# Patient Record
Sex: Male | Born: 1985 | Race: Black or African American | Hispanic: No | Marital: Single | Smoking: Former smoker
Health system: Southern US, Community
[De-identification: ages and names within clinical notes are randomized; demographics above are authoritative.]

## PROBLEM LIST (undated history)

## (undated) DIAGNOSIS — W3400XA Accidental discharge from unspecified firearms or gun, initial encounter: Secondary | ICD-10-CM

## (undated) DIAGNOSIS — R519 Headache, unspecified: Secondary | ICD-10-CM

## (undated) HISTORY — PX: HERNIA REPAIR: SHX51

---
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
## 2008-01-01 ENCOUNTER — Emergency Department (HOSPITAL_COMMUNITY): Admission: EM | Admit: 2008-01-01 | Discharge: 2008-01-01 | Payer: Self-pay | Admitting: Emergency Medicine

## 2009-06-26 ENCOUNTER — Emergency Department (HOSPITAL_COMMUNITY): Admission: EM | Admit: 2009-06-26 | Discharge: 2009-06-26 | Payer: Self-pay | Admitting: Emergency Medicine

## 2010-02-26 ENCOUNTER — Emergency Department (HOSPITAL_COMMUNITY): Admission: EM | Admit: 2010-02-26 | Discharge: 2010-02-26 | Payer: Self-pay | Admitting: Emergency Medicine

## 2010-08-31 ENCOUNTER — Emergency Department (HOSPITAL_COMMUNITY)
Admission: EM | Admit: 2010-08-31 | Discharge: 2010-08-31 | Disposition: A | Discharge status: Home / Self Care | Payer: Self-pay | Attending: Emergency Medicine | Admitting: Emergency Medicine

## 2010-08-31 DIAGNOSIS — J45909 Unspecified asthma, uncomplicated: Secondary | ICD-10-CM | POA: Insufficient documentation

## 2010-08-31 DIAGNOSIS — L259 Unspecified contact dermatitis, unspecified cause: Secondary | ICD-10-CM | POA: Insufficient documentation

## 2010-08-31 DIAGNOSIS — J309 Allergic rhinitis, unspecified: Secondary | ICD-10-CM | POA: Insufficient documentation

## 2010-08-31 DIAGNOSIS — R0602 Shortness of breath: Secondary | ICD-10-CM | POA: Insufficient documentation

## 2010-12-24 ENCOUNTER — Emergency Department (HOSPITAL_COMMUNITY)
Admission: EM | Admit: 2010-12-24 | Discharge: 2010-12-24 | Disposition: A | Discharge status: Home / Self Care | Payer: Self-pay | Attending: Emergency Medicine | Admitting: Emergency Medicine

## 2010-12-24 DIAGNOSIS — R221 Localized swelling, mass and lump, neck: Secondary | ICD-10-CM | POA: Insufficient documentation

## 2010-12-24 DIAGNOSIS — J45909 Unspecified asthma, uncomplicated: Secondary | ICD-10-CM | POA: Insufficient documentation

## 2010-12-24 DIAGNOSIS — R220 Localized swelling, mass and lump, head: Secondary | ICD-10-CM | POA: Insufficient documentation

## 2010-12-27 ENCOUNTER — Inpatient Hospital Stay (INDEPENDENT_AMBULATORY_CARE_PROVIDER_SITE_OTHER)
Admission: RE | Admit: 2010-12-27 | Discharge: 2010-12-27 | Disposition: A | Discharge status: Home / Self Care | Payer: Self-pay | Source: Ambulatory Visit | Attending: Family Medicine | Admitting: Family Medicine

## 2010-12-27 DIAGNOSIS — L0390 Cellulitis, unspecified: Secondary | ICD-10-CM

## 2010-12-30 LAB — CULTURE, ROUTINE-ABSCESS

## 2011-02-04 ENCOUNTER — Emergency Department (HOSPITAL_COMMUNITY)
Admission: EM | Admit: 2011-02-04 | Discharge: 2011-02-05 | Disposition: A | Discharge status: Home / Self Care | Payer: Self-pay | Attending: Emergency Medicine | Admitting: Emergency Medicine

## 2011-02-04 DIAGNOSIS — R066 Hiccough: Secondary | ICD-10-CM | POA: Insufficient documentation

## 2011-02-04 DIAGNOSIS — K219 Gastro-esophageal reflux disease without esophagitis: Secondary | ICD-10-CM | POA: Insufficient documentation

## 2011-02-05 ENCOUNTER — Emergency Department (HOSPITAL_COMMUNITY): Payer: Self-pay

## 2011-02-05 LAB — CBC
HCT: 44.4 % (ref 39.0–52.0)
MCV: 91.9 fL (ref 78.0–100.0)
Platelets: 240 10*3/uL (ref 150–400)
RBC: 4.83 MIL/uL (ref 4.22–5.81)
RDW: 11.4 % — ABNORMAL LOW (ref 11.5–15.5)
WBC: 6.3 10*3/uL (ref 4.0–10.5)

## 2011-02-05 LAB — DIFFERENTIAL
Basophils Absolute: 0 10*3/uL (ref 0.0–0.1)
Eosinophils Relative: 1 % (ref 0–5)
Lymphocytes Relative: 57 % — ABNORMAL HIGH (ref 12–46)
Lymphs Abs: 3.6 10*3/uL (ref 0.7–4.0)
Neutrophils Relative %: 34 % — ABNORMAL LOW (ref 43–77)

## 2011-02-05 LAB — COMPREHENSIVE METABOLIC PANEL
AST: 40 U/L — ABNORMAL HIGH (ref 0–37)
Albumin: 4.5 g/dL (ref 3.5–5.2)
Alkaline Phosphatase: 83 U/L (ref 39–117)
BUN: 11 mg/dL (ref 6–23)
CO2: 29 mEq/L (ref 19–32)
Chloride: 100 mEq/L (ref 96–112)
GFR calc non Af Amer: >60 mL/min (ref >60)
Potassium: 3.2 mEq/L — ABNORMAL LOW (ref 3.5–5.1)
Total Bilirubin: 0.7 mg/dL (ref 0.3–1.2)

## 2011-04-16 ENCOUNTER — Emergency Department (HOSPITAL_COMMUNITY): Payer: Self-pay

## 2011-04-16 ENCOUNTER — Emergency Department (HOSPITAL_COMMUNITY)
Admission: EM | Admit: 2011-04-16 | Discharge: 2011-04-17 | Disposition: A | Discharge status: Home / Self Care | Payer: Self-pay | Attending: Emergency Medicine | Admitting: Emergency Medicine

## 2011-04-16 ENCOUNTER — Encounter: Payer: Self-pay | Admitting: Emergency Medicine

## 2011-04-16 DIAGNOSIS — J45909 Unspecified asthma, uncomplicated: Secondary | ICD-10-CM | POA: Insufficient documentation

## 2011-04-16 DIAGNOSIS — W3400XA Accidental discharge from unspecified firearms or gun, initial encounter: Secondary | ICD-10-CM

## 2011-04-16 DIAGNOSIS — Z1810 Retained metal fragments, unspecified: Secondary | ICD-10-CM | POA: Insufficient documentation

## 2011-04-16 DIAGNOSIS — S81009A Unspecified open wound, unspecified knee, initial encounter: Secondary | ICD-10-CM | POA: Insufficient documentation

## 2011-04-16 DIAGNOSIS — M79609 Pain in unspecified limb: Secondary | ICD-10-CM | POA: Insufficient documentation

## 2011-04-16 LAB — BASIC METABOLIC PANEL
BUN: 10 mg/dL (ref 6–23)
Calcium: 8.9 mg/dL (ref 8.4–10.5)
Creatinine, Ser: 1.47 mg/dL — ABNORMAL HIGH (ref 0.50–1.35)
GFR calc Af Amer: 76 mL/min — ABNORMAL LOW (ref >90)
GFR calc non Af Amer: 66 mL/min — ABNORMAL LOW (ref >90)

## 2011-04-16 LAB — CBC
HCT: 39 % (ref 39.0–52.0)
MCHC: 34.9 g/dL (ref 30.0–36.0)
MCV: 93.8 fL (ref 78.0–100.0)
Platelets: 183 10*3/uL (ref 150–400)
RDW: 11.8 % (ref 11.5–15.5)

## 2011-04-16 MED ORDER — CEFAZOLIN SODIUM 1-5 GM-% IV SOLN
1.0000 g | Freq: Once | INTRAVENOUS | Status: AC
  Administered 2011-04-16: 1 g via INTRAVENOUS
  Filled 2011-04-16: qty 50

## 2011-04-16 MED ORDER — SODIUM CHLORIDE 0.9 % IV BOLUS (SEPSIS)
250.0000 mL | Freq: Once | INTRAVENOUS | Status: AC
  Administered 2011-04-16: 250 mL via INTRAVENOUS

## 2011-04-16 MED ORDER — NAPROXEN 500 MG PO TABS: 500.0000 mg | ORAL_TABLET | Freq: Two times a day (BID) | ORAL | Status: AC

## 2011-04-16 MED ORDER — HYDROMORPHONE HCL 2 MG/ML IJ SOLN: 1.0000 mg | Freq: Once | INTRAMUSCULAR | Status: DC

## 2011-04-16 MED ORDER — SODIUM CHLORIDE 0.9 % IV SOLN
INTRAVENOUS | Status: DC
  Administered 2011-04-16: 22:00:00 via INTRAVENOUS

## 2011-04-16 MED ORDER — HYDROCODONE-ACETAMINOPHEN 5-325 MG PO TABS: 2.0000 | ORAL_TABLET | ORAL | Status: AC | PRN

## 2011-04-16 MED ORDER — HYDROMORPHONE HCL PF 1 MG/ML IJ SOLN
INTRAMUSCULAR | Status: AC
  Filled 2011-04-16: qty 1

## 2011-04-16 MED ORDER — TETANUS-DIPHTH-ACELL PERTUSSIS 5-2.5-18.5 LF-MCG/0.5 IM SUSP
0.5000 mL | Freq: Once | INTRAMUSCULAR | Status: AC
  Administered 2011-04-16: 0.5 mL via INTRAMUSCULAR
  Filled 2011-04-16: qty 0.5

## 2011-04-16 MED ORDER — HYDROMORPHONE HCL PF 1 MG/ML IJ SOLN
1.0000 mg | Freq: Once | INTRAMUSCULAR | Status: AC
  Administered 2011-04-16: 1 mg via INTRAVENOUS
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
## 2015-11-28 ENCOUNTER — Encounter (HOSPITAL_COMMUNITY): Payer: Self-pay | Admitting: Emergency Medicine

## 2015-11-28 DIAGNOSIS — S70312A Abrasion, left thigh, initial encounter: Secondary | ICD-10-CM | POA: Insufficient documentation

## 2015-11-28 DIAGNOSIS — S92421A Displaced fracture of distal phalanx of right great toe, initial encounter for closed fracture: Secondary | ICD-10-CM | POA: Insufficient documentation

## 2015-11-28 DIAGNOSIS — Y999 Unspecified external cause status: Secondary | ICD-10-CM | POA: Insufficient documentation

## 2015-11-28 DIAGNOSIS — Y929 Unspecified place or not applicable: Secondary | ICD-10-CM | POA: Insufficient documentation

## 2015-11-28 DIAGNOSIS — S00511A Abrasion of lip, initial encounter: Secondary | ICD-10-CM | POA: Insufficient documentation

## 2015-11-28 DIAGNOSIS — Y9339 Activity, other involving climbing, rappelling and jumping off: Secondary | ICD-10-CM | POA: Insufficient documentation

## 2015-11-28 DIAGNOSIS — Z87891 Personal history of nicotine dependence: Secondary | ICD-10-CM | POA: Insufficient documentation

## 2015-11-28 DIAGNOSIS — S93402A Sprain of unspecified ligament of left ankle, initial encounter: Secondary | ICD-10-CM | POA: Insufficient documentation

## 2015-11-28 DIAGNOSIS — J45909 Unspecified asthma, uncomplicated: Secondary | ICD-10-CM | POA: Insufficient documentation

## 2015-11-28 DIAGNOSIS — W1789XA Other fall from one level to another, initial encounter: Secondary | ICD-10-CM | POA: Insufficient documentation

## 2015-11-28 NOTE — ED Notes (Signed)
Pt st's he was running and jump off of a step.  Pt c/o pain to left ankle and right great toe

## 2015-11-29 ENCOUNTER — Emergency Department (HOSPITAL_COMMUNITY): Payer: Self-pay

## 2015-11-29 ENCOUNTER — Emergency Department (HOSPITAL_COMMUNITY)
Admission: EM | Admit: 2015-11-29 | Discharge: 2015-11-29 | Disposition: A | Discharge status: Home / Self Care | Payer: Self-pay | Attending: Emergency Medicine | Admitting: Emergency Medicine

## 2015-11-29 DIAGNOSIS — S93402A Sprain of unspecified ligament of left ankle, initial encounter: Secondary | ICD-10-CM

## 2015-11-29 DIAGNOSIS — S92401A Displaced unspecified fracture of right great toe, initial encounter for closed fracture: Secondary | ICD-10-CM

## 2015-11-29 DIAGNOSIS — T07XXXA Unspecified multiple injuries, initial encounter: Secondary | ICD-10-CM

## 2015-11-29 HISTORY — DX: Accidental discharge from unspecified firearms or gun, initial encounter: W34.00XA

## 2015-11-29 MED ORDER — HYDROCODONE-ACETAMINOPHEN 5-325 MG PO TABS: 1.0000 | ORAL_TABLET | Freq: Four times a day (QID) | ORAL | Status: DC | PRN

## 2015-11-29 MED ORDER — TETANUS-DIPHTH-ACELL PERTUSSIS 5-2.5-18.5 LF-MCG/0.5 IM SUSP
0.5000 mL | Freq: Once | INTRAMUSCULAR | Status: AC
  Administered 2015-11-29: 0.5 mL via INTRAMUSCULAR
  Filled 2015-11-29: qty 0.5

## 2015-11-29 MED ORDER — NAPROXEN 500 MG PO TABS: 500.0000 mg | ORAL_TABLET | Freq: Two times a day (BID) | ORAL | Status: DC
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
## 2015-11-29 NOTE — Discharge Instructions (Signed)
Follow up with the orthopedic doctor for your broken toe and ankle sprain. Do not take the narcotic pain medication if driving as it will make you sleepy.

## 2017-04-18 IMAGING — CR DG TOE GREAT 2+V*R*
3 series · 3 of 3 positions shown · non-contrast
Comparison: None.

CLINICAL DATA: Injury to the right great toe.  Initial encounter.

EXAM:
RIGHT GREAT TOE

[toe ap]
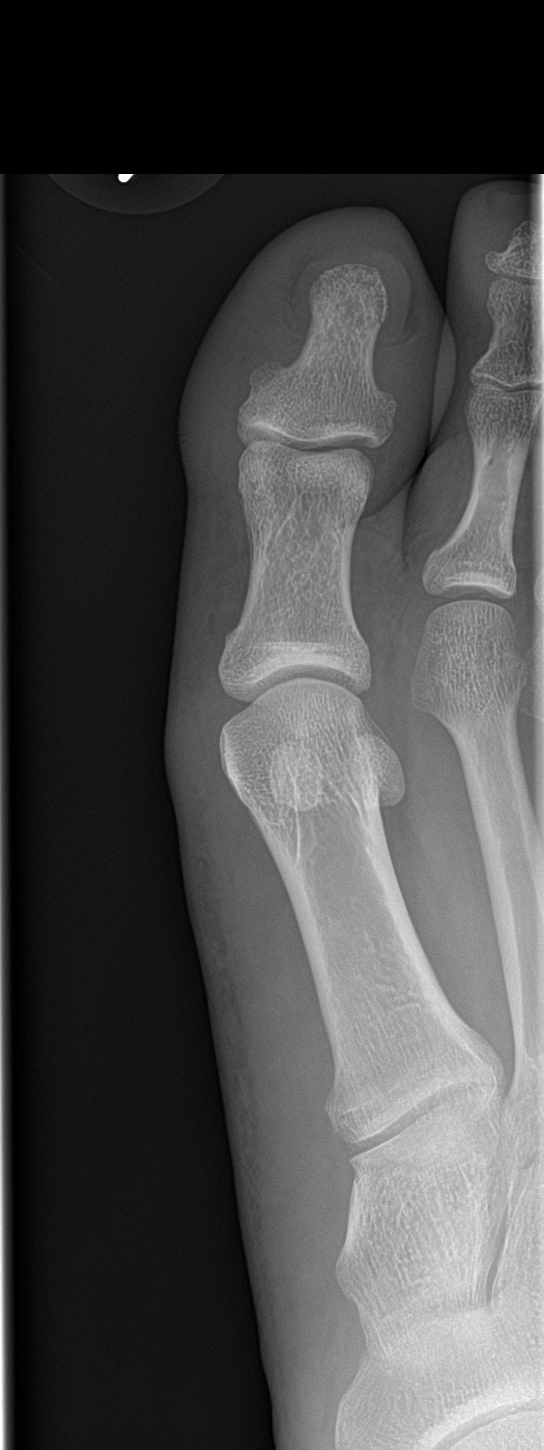

[toe obl]
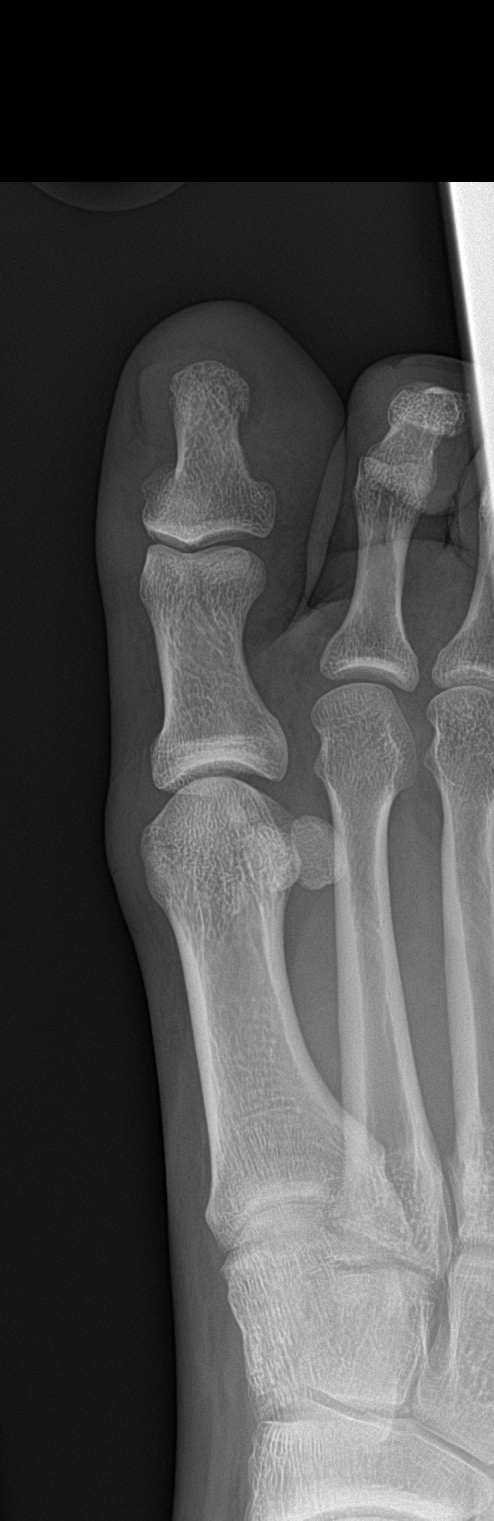

[toe lat]
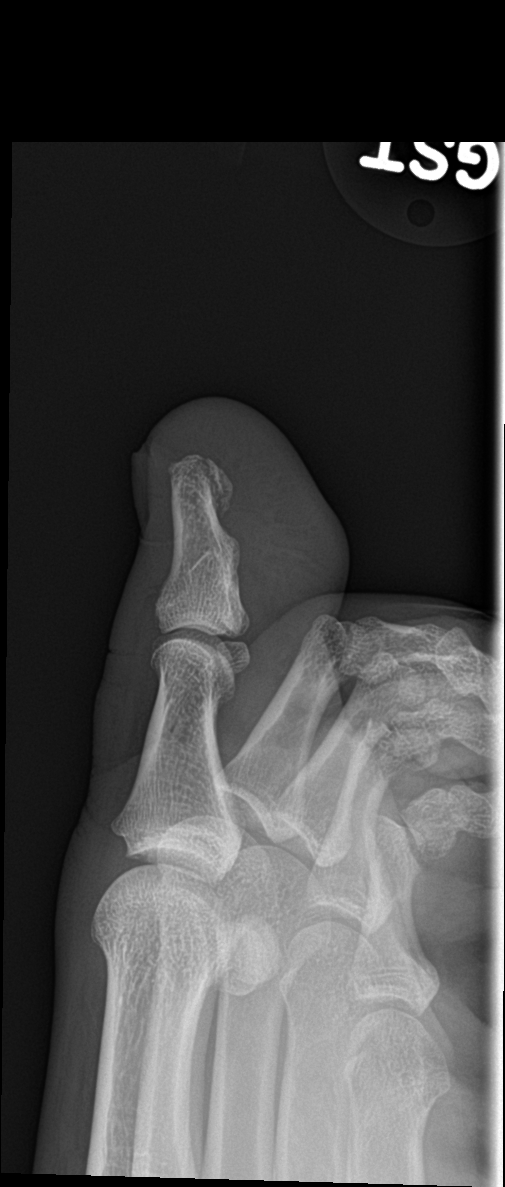

[3 of 3 positions shown; findings below may reference images not displayed]

FINDINGS: There is suggestion of a small fracture involving the plantar aspect
of the distal tuft of the first distal phalanx.

Visualized joint spaces are preserved. No definite soft tissue
abnormalities are characterized on radiograph.
IMPRESSION: Suggestion of small fracture involving the plantar aspect of the
distal tuft of the first distal phalanx.

## 2018-04-08 ENCOUNTER — Ambulatory Visit (HOSPITAL_COMMUNITY)
Admission: EM | Admit: 2018-04-08 | Discharge: 2018-04-08 | Disposition: A | Discharge status: Home / Self Care | Payer: Self-pay | Attending: Family Medicine | Admitting: Family Medicine

## 2018-04-08 ENCOUNTER — Encounter (HOSPITAL_COMMUNITY): Payer: Self-pay | Admitting: Emergency Medicine

## 2018-04-08 DIAGNOSIS — Z202 Contact with and (suspected) exposure to infections with a predominantly sexual mode of transmission: Secondary | ICD-10-CM | POA: Insufficient documentation

## 2018-04-08 DIAGNOSIS — Z113 Encounter for screening for infections with a predominantly sexual mode of transmission: Secondary | ICD-10-CM

## 2018-04-08 DIAGNOSIS — Z87891 Personal history of nicotine dependence: Secondary | ICD-10-CM | POA: Insufficient documentation

## 2018-04-08 MED ORDER — AZITHROMYCIN 250 MG PO TABS
ORAL_TABLET | ORAL | Status: AC
  Filled 2018-04-08: qty 4

## 2018-04-08 MED ORDER — METRONIDAZOLE 500 MG PO TABS
ORAL_TABLET | ORAL | Status: AC
  Filled 2018-04-08: qty 4

## 2018-04-08 MED ORDER — LIDOCAINE HCL (PF) 1 % IJ SOLN
INTRAMUSCULAR | Status: AC
  Filled 2018-04-08: qty 2

## 2018-04-08 MED ORDER — CEFTRIAXONE SODIUM 250 MG IJ SOLR
INTRAMUSCULAR | Status: AC
  Filled 2018-04-08: qty 250

## 2018-04-08 MED ORDER — CEFTRIAXONE SODIUM 250 MG IJ SOLR
250.0000 mg | Freq: Once | INTRAMUSCULAR | Status: AC
  Administered 2018-04-08: 250 mg via INTRAMUSCULAR

## 2018-04-08 MED ORDER — METRONIDAZOLE 500 MG PO TABS
2000.0000 mg | ORAL_TABLET | Freq: Once | ORAL | Status: AC
  Administered 2018-04-08: 2000 mg via ORAL

## 2018-04-08 MED ORDER — AZITHROMYCIN 250 MG PO TABS
1000.0000 mg | ORAL_TABLET | Freq: Once | ORAL | Status: AC
  Administered 2018-04-08: 1000 mg via ORAL
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
## 2018-04-08 NOTE — ED Triage Notes (Signed)
Pt requesting STD testing and treatment

## 2018-04-08 NOTE — Discharge Instructions (Addendum)
You were treated empirically for gonorrhea, chlamydia, trichomonas.  Flagyl 2 g by mouth, azithromycin 1g by mouth and Rocephin 250mg  injection given in office today. Cytology sent, you will be contacted with any positive results that requires further treatment. Refrain from sexual activity and alcohol use for the next 7 days.

## 2018-04-09 LAB — URINE CYTOLOGY ANCILLARY ONLY
CHLAMYDIA, DNA PROBE: NEGATIVE
Neisseria Gonorrhea: NEGATIVE
Trichomonas: NEGATIVE

## 2021-04-12 ENCOUNTER — Encounter: Payer: Self-pay | Admitting: Emergency Medicine

## 2021-04-12 ENCOUNTER — Ambulatory Visit
Admission: EM | Admit: 2021-04-12 | Discharge: 2021-04-12 | Disposition: A | Discharge status: Home / Self Care | Payer: PRIVATE HEALTH INSURANCE | Attending: Physician Assistant | Admitting: Physician Assistant

## 2021-04-12 DIAGNOSIS — R220 Localized swelling, mass and lump, head: Secondary | ICD-10-CM

## 2021-04-12 MED ORDER — PREDNISONE 20 MG PO TABS: 40.0000 mg | ORAL_TABLET | Freq: Every day | ORAL | 0 refills | Status: AC

## 2021-04-12 NOTE — ED Triage Notes (Signed)
Patient c/o lip swelling x 4 days, not sure if it's allergy or tooth related.  No change in diet.  Patient has not taken Benadryl for the swelling.
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
## 2021-04-18 ENCOUNTER — Other Ambulatory Visit: Payer: Self-pay

## 2021-04-18 ENCOUNTER — Ambulatory Visit
Admission: EM | Admit: 2021-04-18 | Discharge: 2021-04-18 | Disposition: A | Discharge status: Home / Self Care | Payer: PRIVATE HEALTH INSURANCE | Attending: Physician Assistant | Admitting: Physician Assistant

## 2021-04-18 ENCOUNTER — Ambulatory Visit: Payer: Self-pay

## 2021-04-18 ENCOUNTER — Encounter: Payer: Self-pay | Admitting: Physician Assistant

## 2021-04-18 DIAGNOSIS — R220 Localized swelling, mass and lump, head: Secondary | ICD-10-CM | POA: Diagnosis not present

## 2021-04-18 MED ORDER — DOXYCYCLINE HYCLATE 100 MG PO CAPS: 100.0000 mg | ORAL_CAPSULE | Freq: Two times a day (BID) | ORAL | 0 refills | Status: DC
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
## 2021-04-18 NOTE — Telephone Encounter (Signed)
Pt c/o persistent upper lip swelling and swelling along the bridge of his nose. Pt went to [facility] 04/12/21 and was given 5 day of Prednisone. Pt stated that med did not help. Pt stated the area feels like their is fluid or something in the swelling. Pt stated the inside of his upper lip has a place but did not elaborate.  No pain or itching. Advised pt to go back to UC within the hour to get evaluated. Pt stated he will go -Called UC at [facility] and transferred pt to them.        Reason for Disposition  Lip swelling lasts > 3 days  Answer Assessment - Initial Assessment Questions 1. ONSET: "When did the swelling start?" (e.g., minutes, hours, days)    04/08/21 2. LOCATION: "What part of the face is swollen?"     Area above upper lip and along bridge of his nose 3. SEVERITY: "How swollen is it?"     Pt stated it is very noticeable especially when he smiles 4. ITCHING: "Is there any itching?" If Yes, ask: "How much?"   (Scale 1-10; mild, moderate or severe)      5. PAIN: "Is the swelling painful to touch?" If Yes, ask: "How painful is it?"   (Scale 1-10; mild, moderate or severe)   - NONE (0): no pain   - MILD (1-3): doesn't interfere with normal activities    - MODERATE (4-7): interferes with normal activities or awakens from sleep    - SEVERE (8-10): excruciating pain, unable to do any normal activities       6. FEVER: "Do you have a fever?" If Yes, ask: "What is it, how was it measured, and when did it start?"      no 7. CAUSE: "What do you think is causing the face swelling?"     Unknown at this time 8. RECURRENT SYMPTOM: "Have you had face swelling before?" If Yes, ask: "When was the last time?" "What happened that time?"     Yes-  9. OTHER SYMPTOMS: "Do you have any other symptoms?" (e.g., toothache, leg swelling)     no 10. PREGNANCY: "Is there any chance you are pregnant?" "When was your last menstrual period?"       N/a  Answer Assessment - Initial Assessment Questions 1.  ONSET: "When did the swelling start?" (e.g., minutes, hours, days)     04/08/21 2. SEVERITY: "How swollen is it?"     Pt stated it is sticking out and very noticeable when smiling 3. ITCHING: "Is there any itching?" If Yes, ask: "How much?"   (Scale 1-10; mild, moderate or severe)     no 4. PAIN: "Is the swelling painful to touch?" If Yes, ask: "How painful is it?"   (Scale 1-10; mild, moderate or severe)     no 5. CAUSE: "What do you think is causing the lip swelling?"     unknown 6. RECURRENT SYMPTOM: "Have you had lip swelling before?" If Yes, ask: "When was the last time?" "What happened that time?"     no 7. OTHER SYMPTOMS: "Do you have any other symptoms?" (e.g., toothache)     no 8. PREGNANCY: "Is there any chance you are pregnant?" "When was your last menstrual period?"     N/a  Protocols used: Face Swelling-A-AH, Lip Swelling-A-AH

## 2021-04-18 NOTE — ED Triage Notes (Signed)
Pt c/o swelling with a hard knot to top of lip for over a week. States treated with prednisone last week with no relief. Denies drainage at this time. States it has been cut open before.

## 2021-07-03 ENCOUNTER — Other Ambulatory Visit: Payer: Self-pay

## 2021-07-04 ENCOUNTER — Encounter: Payer: Self-pay | Admitting: Family Medicine

## 2021-07-04 ENCOUNTER — Other Ambulatory Visit (HOSPITAL_COMMUNITY)
Admission: RE | Admit: 2021-07-04 | Discharge: 2021-07-04 | Disposition: A | Discharge status: Home / Self Care | Payer: PRIVATE HEALTH INSURANCE | Source: Ambulatory Visit | Attending: Family Medicine | Admitting: Family Medicine

## 2021-07-04 ENCOUNTER — Ambulatory Visit (INDEPENDENT_AMBULATORY_CARE_PROVIDER_SITE_OTHER): Payer: PRIVATE HEALTH INSURANCE | Admitting: Family Medicine

## 2021-07-04 VITALS — BP 128/76 | HR 69 | Temp 97.8°F | Ht 68.0 in | Wt 211.2 lb

## 2021-07-04 DIAGNOSIS — Z113 Encounter for screening for infections with a predominantly sexual mode of transmission: Secondary | ICD-10-CM | POA: Insufficient documentation

## 2021-07-04 DIAGNOSIS — F5101 Primary insomnia: Secondary | ICD-10-CM | POA: Insufficient documentation

## 2021-07-04 DIAGNOSIS — Z114 Encounter for screening for human immunodeficiency virus [HIV]: Secondary | ICD-10-CM

## 2021-07-04 DIAGNOSIS — L720 Epidermal cyst: Secondary | ICD-10-CM | POA: Diagnosis not present

## 2021-07-04 DIAGNOSIS — Z0000 Encounter for general adult medical examination without abnormal findings: Secondary | ICD-10-CM

## 2021-07-04 DIAGNOSIS — K4091 Unilateral inguinal hernia, without obstruction or gangrene, recurrent: Secondary | ICD-10-CM | POA: Diagnosis not present

## 2021-07-04 DIAGNOSIS — L309 Dermatitis, unspecified: Secondary | ICD-10-CM | POA: Insufficient documentation

## 2021-07-04 DIAGNOSIS — Z1159 Encounter for screening for other viral diseases: Secondary | ICD-10-CM

## 2021-07-04 LAB — LIPID PANEL
Cholesterol: 188 mg/dL (ref 0–200)
HDL: 36.4 mg/dL — ABNORMAL LOW (ref >39.00)
LDL Cholesterol: 116 mg/dL — ABNORMAL HIGH (ref 0–99)
NonHDL: 151.38
Total CHOL/HDL Ratio: 5
Triglycerides: 177 mg/dL — ABNORMAL HIGH (ref 0.0–149.0)
VLDL: 35.4 mg/dL (ref 0.0–40.0)

## 2021-07-04 LAB — CBC
HCT: 44.6 % (ref 39.0–52.0)
Hemoglobin: 15 g/dL (ref 13.0–17.0)
MCHC: 33.6 g/dL (ref 30.0–36.0)
MCV: 96.8 fl (ref 78.0–100.0)
Platelets: 192 10*3/uL (ref 150.0–400.0)
RBC: 4.6 Mil/uL (ref 4.22–5.81)
RDW: 12.9 % (ref 11.5–15.5)
WBC: 4.5 10*3/uL (ref 4.0–10.5)

## 2021-07-04 LAB — COMPREHENSIVE METABOLIC PANEL
ALT: 20 U/L (ref 0–53)
AST: 22 U/L (ref 0–37)
Albumin: 4.3 g/dL (ref 3.5–5.2)
Alkaline Phosphatase: 68 U/L (ref 39–117)
BUN: 15 mg/dL (ref 6–23)
CO2: 28 mEq/L (ref 19–32)
Calcium: 9.3 mg/dL (ref 8.4–10.5)
Chloride: 105 mEq/L (ref 96–112)
Creatinine, Ser: 1.36 mg/dL (ref 0.40–1.50)
GFR: 68.13 mL/min (ref >60.00)
Glucose, Bld: 97 mg/dL (ref 70–99)
Potassium: 3.6 mEq/L (ref 3.5–5.1)
Sodium: 139 mEq/L (ref 135–145)
Total Bilirubin: 0.5 mg/dL (ref 0.2–1.2)
Total Protein: 7.5 g/dL (ref 6.0–8.3)

## 2021-07-04 LAB — URINALYSIS, ROUTINE W REFLEX MICROSCOPIC
Bilirubin Urine: NEGATIVE
Hgb urine dipstick: NEGATIVE
Ketones, ur: NEGATIVE
Leukocytes,Ua: NEGATIVE
Nitrite: NEGATIVE
RBC / HPF: NONE SEEN (ref 0–?)
Specific Gravity, Urine: >=1.03 — AB (ref 1.000–1.030)
Total Protein, Urine: NEGATIVE
Urine Glucose: NEGATIVE
Urobilinogen, UA: 0.2 (ref 0.0–1.0)
WBC, UA: NONE SEEN (ref 0–?)
pH: 5.5 (ref 5.0–8.0)

## 2021-07-04 MED ORDER — HYDROCORTISONE VALERATE 0.2 % EX CREA: 1.0000 | TOPICAL_CREAM | Freq: Two times a day (BID) | CUTANEOUS | 0 refills | Status: DC
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
## 2021-07-05 LAB — URINE CYTOLOGY ANCILLARY ONLY
Chlamydia: NEGATIVE
Comment: NEGATIVE

## 2021-07-05 LAB — HEPATITIS C ANTIBODY
Hepatitis C Ab: NONREACTIVE
SIGNAL TO CUT-OFF: 0.02 (ref <1.00)

## 2021-07-05 LAB — HIV ANTIBODY (ROUTINE TESTING W REFLEX): HIV 1&2 Ab, 4th Generation: NONREACTIVE

## 2021-08-14 ENCOUNTER — Other Ambulatory Visit: Payer: Self-pay

## 2021-08-14 ENCOUNTER — Ambulatory Visit
Admission: EM | Admit: 2021-08-14 | Discharge: 2021-08-14 | Disposition: A | Discharge status: Home / Self Care | Payer: PRIVATE HEALTH INSURANCE | Attending: Internal Medicine | Admitting: Internal Medicine

## 2021-08-14 ENCOUNTER — Encounter: Payer: Self-pay | Admitting: Emergency Medicine

## 2021-08-14 DIAGNOSIS — K130 Diseases of lips: Secondary | ICD-10-CM

## 2021-08-14 MED ORDER — DOXYCYCLINE HYCLATE 100 MG PO CAPS
100.0000 mg | ORAL_CAPSULE | Freq: Two times a day (BID) | ORAL | 0 refills | Status: AC
Start: 2021-08-14 — End: ?
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
## 2021-08-14 NOTE — ED Triage Notes (Signed)
Patient c/o abscess on his upper lip for a couple of days.  It feels like a pocket of infection.  Patient had the same thing several months ago.  The area is not painful at this time. ?

## 2021-08-14 NOTE — Discharge Instructions (Signed)
You have been prescribed an antibiotic.  Please use warm compresses.  Follow-up if symptoms persist or worsen. ?

## 2021-09-11 ENCOUNTER — Telehealth: Payer: Self-pay | Admitting: Family Medicine

## 2021-09-11 NOTE — Telephone Encounter (Signed)
Pt called about labs taken in February ... he thought there would be more sti testing. Would like a call back from nurse.  ?

## 2021-09-13 NOTE — Telephone Encounter (Signed)
Patient verbally understood HIV and Chlamydia were the only STD test that were ran, patient wasn't sure why others were not ran but state he will request this at next visit.  ?

## 2022-07-06 ENCOUNTER — Encounter: Payer: PRIVATE HEALTH INSURANCE | Admitting: Family Medicine

## 2023-07-19 ENCOUNTER — Ambulatory Visit: Payer: Self-pay | Admitting: General Surgery

## 2023-07-19 DIAGNOSIS — K4091 Unilateral inguinal hernia, without obstruction or gangrene, recurrent: Secondary | ICD-10-CM

## 2023-07-19 NOTE — Progress Notes (Signed)
 Sent message, via epic in basket, requesting orders in epic from Careers adviser.

## 2023-07-25 NOTE — Patient Instructions (Signed)
 SURGICAL WAITING ROOM VISITATION  Patients having surgery or a procedure may have no more than 2 support people in the waiting area - these visitors may rotate.    Children under the age of 28 must have an adult with them who is not the patient.  Due to an increase in RSV and influenza rates and associated hospitalizations, children ages 63 and under may not visit patients in [facility] hospitals.  Visitors with respiratory illnesses are discouraged from visiting and should remain at home.  If the patient needs to stay at the hospital during part of their recovery, the visitor guidelines for inpatient rooms apply. Pre-op nurse will coordinate an appropriate time for 1 support person to accompany patient in pre-op.  This support person may not rotate.    Please refer to the [facility] website for the visitor guidelines for Inpatients (after your surgery is over and you are in a regular room).       Your procedure is scheduled on: 08/05/23   Report to [facility],The Main Entrance    Report to admitting at 7:15 AM   Call this number if you have problems the morning of surgery [phone]   Do not eat food :After Midnight.   After Midnight you may have the following liquids until 6:30 AM DAY OF SURGERY  Water Non-Citrus Juices (without pulp, NO RED-Apple, White grape, White cranberry) Black Coffee (NO MILK/CREAM OR CREAMERS, sugar ok)  Clear Tea (NO MILK/CREAM OR CREAMERS, sugar ok) regular and decaf                             Plain Jell-O (NO RED)                                           Fruit ices (not with fruit pulp, NO RED)                                     Popsicles (NO RED)                                                               Sports drinks like Gatorade (NO RED)                    Oral Hygiene is also important to reduce your risk of infection.                                    Remember - BRUSH YOUR TEETH THE MORNING OF SURGERY WITH YOUR REGULAR  TOOTHPASTE   Stop all vitamins and herbal supplements 7 days before surgery.   Take these medicines the morning of surgery with A SIP OF WATER: none             You may not have any metal on your body including hair pins, jewelry, and body piercing             Do not wear make-up, lotions,  powders, perfumes/cologne, or deodorant              Men may shave face and neck.   Do not bring valuables to the hospital. [facility] IS NOT             RESPONSIBLE   FOR VALUABLES.   Contacts, glasses, dentures or bridgework may not be worn into surgery.  DO NOT BRING YOUR HOME MEDICATIONS TO THE HOSPITAL. PHARMACY WILL DISPENSE MEDICATIONS LISTED ON YOUR MEDICATION LIST TO YOU DURING YOUR ADMISSION IN THE HOSPITAL!    Patients discharged on the day of surgery will not be allowed to drive home.  Someone NEEDS to stay with you for the first 24 hours after anesthesia.   Special Instructions: Bring a copy of your healthcare power of attorney and living will documents the day of surgery if you haven't scanned them before.              Please read over the following fact sheets you were given: IF YOU HAVE QUESTIONS ABOUT YOUR PRE-OP INSTRUCTIONS PLEASE CALL [phone] Rosey Bath   If you received a COVID test during your pre-op visit  it is requested that you wear a mask when out in public, stay away from anyone that may not be feeling well and notify your surgeon if you develop symptoms. If you test positive for Covid or have been in contact with anyone that has tested positive in the last 10 days please notify you surgeon.    [facility] - Preparing for Surgery Before surgery, you can play an important role.  Because skin is not sterile, your skin needs to be as free of germs as possible.  You can reduce the number of germs on your skin by washing with CHG (chlorahexidine gluconate) soap before surgery.  CHG is an antiseptic cleaner which kills germs and bonds with the skin to continue killing germs  even after washing. Please DO NOT use if you have an allergy to CHG or antibacterial soaps.  If your skin becomes reddened/irritated stop using the CHG and inform your nurse when you arrive at Short Stay. Do not shave (including legs and underarms) for at least 48 hours prior to the first CHG shower.  You may shave your face/neck.  Please follow these instructions carefully:  1.  Shower with CHG Soap the night before surgery and the  morning of surgery.  2.  If you choose to wash your hair, wash your hair first as usual with your normal  shampoo.  3.  After you shampoo, rinse your hair and body thoroughly to remove the shampoo.                             4.  Use CHG as you would any other liquid soap.  You can apply chg directly to the skin and wash.  Gently with a scrungie or clean washcloth.  5.  Apply the CHG Soap to your body ONLY FROM THE NECK DOWN.   Do   not use on face/ open                           Wound or open sores. Avoid contact with eyes, ears mouth and   genitals (private parts).                       Wash face,  Genitals (  private parts) with your normal soap.             6.  Wash thoroughly, paying special attention to the area where your    surgery  will be performed.  7.  Thoroughly rinse your body with warm water from the neck down.  8.  DO NOT shower/wash with your normal soap after using and rinsing off the CHG Soap.                9.  Pat yourself dry with a clean towel.            10.  Wear clean pajamas.            11.  Place clean sheets on your bed the night of your first shower and do not  sleep with pets. Day of Surgery : Do not apply any lotions/deodorants the morning of surgery.  Please wear clean clothes to the hospital/surgery center.  FAILURE TO FOLLOW THESE INSTRUCTIONS MAY RESULT IN THE CANCELLATION OF YOUR SURGERY  PATIENT SIGNATURE_________________________________  NURSE  SIGNATURE__________________________________  ________________________________________________________________________

## 2023-07-25 NOTE — Progress Notes (Signed)
 COVID Vaccine received:  [x]  No []  Yes Date of any COVID positive Test in last 90 days: no PCP - Dr. Nadene Rubins Cardiologist - n/a  Chest x-ray -  EKG -   Stress Test -  ECHO -  Cardiac Cath -   Bowel Prep - [x]  No  []   Yes ______  Pacemaker / ICD device [x]  No []  Yes   Spinal Cord Stimulator:[x]  No []  Yes       History of Sleep Apnea? [x]  No []  Yes   CPAP used?- [x]  No []  Yes    Does the patient monitor blood sugar?          [x]  No []  Yes  []  N/A  Patient has: [x]  NO Hx DM   []  Pre-DM                 []  DM1  []   DM2 Does patient have a [employer] or Dexacom? []  No []  Yes   Fasting Blood Sugar Ranges-  Checks Blood Sugar _____ times a day  GLP1 agonist / usual dose - no GLP1 instructions:  SGLT-2 inhibitors / usual dose - no SGLT-2 instructions:   Blood Thinner / Instructions:no Aspirin Instructions:no  Comments:   Activity level: Patient is able  to climb a flight of stairs without difficulty; [x]  No CP  [x]  No SOB,   Patient can / perform ADLs without assistance.   Anesthesia review:   Patient denies shortness of breath, fever, cough and chest pain at PAT appointment.  Patient verbalized understanding and agreement to the Pre-Surgical Instructions that were given to them at this PAT appointment. Patient was also educated of the need to review these PAT instructions again prior to his/her surgery.I reviewed the appropriate phone numbers to call if they have any and questions or concerns.

## 2023-07-26 ENCOUNTER — Encounter (HOSPITAL_COMMUNITY)
Admission: RE | Admit: 2023-07-26 | Discharge: 2023-07-26 | Disposition: A | Discharge status: Home / Self Care | Payer: Medicaid Other | Source: Ambulatory Visit | Attending: General Surgery | Admitting: General Surgery

## 2023-07-26 ENCOUNTER — Other Ambulatory Visit: Payer: Self-pay

## 2023-07-26 ENCOUNTER — Encounter (HOSPITAL_COMMUNITY): Payer: Self-pay

## 2023-07-26 DIAGNOSIS — K4091 Unilateral inguinal hernia, without obstruction or gangrene, recurrent: Secondary | ICD-10-CM | POA: Insufficient documentation

## 2023-07-26 DIAGNOSIS — Z01812 Encounter for preprocedural laboratory examination: Secondary | ICD-10-CM | POA: Diagnosis present

## 2023-07-26 HISTORY — DX: Headache, unspecified: R51.9

## 2023-07-26 LAB — CBC WITH DIFFERENTIAL/PLATELET
Abs Immature Granulocytes: 0.02 10*3/uL (ref 0.00–0.07)
Basophils Absolute: 0 10*3/uL (ref 0.0–0.1)
Basophils Relative: 1 %
Eosinophils Absolute: 0.3 10*3/uL (ref 0.0–0.5)
Eosinophils Relative: 5 %
HCT: 43.6 % (ref 39.0–52.0)
Hemoglobin: 15 g/dL (ref 13.0–17.0)
Immature Granulocytes: 0 %
Lymphocytes Relative: 49 %
Lymphs Abs: 2.2 10*3/uL (ref 0.7–4.0)
MCH: 33.7 pg (ref 26.0–34.0)
MCHC: 34.4 g/dL (ref 30.0–36.0)
MCV: 98 fL (ref 80.0–100.0)
Monocytes Absolute: 0.5 10*3/uL (ref 0.1–1.0)
Monocytes Relative: 10 %
Neutro Abs: 1.6 10*3/uL — ABNORMAL LOW (ref 1.7–7.7)
Neutrophils Relative %: 35 %
Platelets: 208 10*3/uL (ref 150–400)
RBC: 4.45 MIL/uL (ref 4.22–5.81)
RDW: 11.4 % — ABNORMAL LOW (ref 11.5–15.5)
WBC: 4.6 10*3/uL (ref 4.0–10.5)
nRBC: 0 % (ref 0.0–0.2)

## 2023-07-26 LAB — BASIC METABOLIC PANEL
Anion gap: 8 (ref 5–15)
BUN: 14 mg/dL (ref 6–20)
CO2: 26 mmol/L (ref 22–32)
Calcium: 9.3 mg/dL (ref 8.9–10.3)
Chloride: 105 mmol/L (ref 98–111)
Creatinine, Ser: 1.32 mg/dL — ABNORMAL HIGH (ref 0.61–1.24)
GFR, Estimated: >60 mL/min (ref >60)
Glucose, Bld: 98 mg/dL (ref 70–99)
Potassium: 3.9 mmol/L (ref 3.5–5.1)
Sodium: 139 mmol/L (ref 135–145)

## 2023-08-02 NOTE — Anesthesia Preprocedure Evaluation (Addendum)
 Anesthesia Evaluation  Patient identified by MRN, date of birth, ID band Patient awake    Reviewed: Allergy & Precautions, NPO status , Patient's Chart, lab work & pertinent test results  Airway Mallampati: II  TM Distance: >3 FB Neck ROM: Full    Dental no notable dental hx. (+) Missing,    Pulmonary asthma (well controlled) , former smoker   Pulmonary exam normal breath sounds clear to auscultation       Cardiovascular negative cardio ROS Normal cardiovascular exam Rhythm:Regular Rate:Normal     Neuro/Psych  Headaches  negative psych ROS   GI/Hepatic negative GI ROS, Neg liver ROS,,,  Endo/Other  negative endocrine ROS    Renal/GU Renal InsufficiencyRenal diseaseCr 1.32  negative genitourinary   Musculoskeletal negative musculoskeletal ROS (+)    Abdominal   Peds  Hematology negative hematology ROS (+) Hb 15   Anesthesia Other Findings   Reproductive/Obstetrics negative OB ROS                             Anesthesia Physical Anesthesia Plan  ASA: 2  Anesthesia Plan: General and Regional   Post-op Pain Management: Regional block*, Tylenol PO (pre-op)*, Dilaudid IV, Ketamine IV* and Precedex   Induction: Intravenous  PONV Risk Score and Plan: 2 and 3 and Ondansetron, Dexamethasone, Midazolam and Treatment may vary due to age or medical condition  Airway Management Planned: Oral ETT  Additional Equipment: None  Intra-op Plan:   Post-operative Plan: Extubation in OR  Informed Consent: I have reviewed the patients History and Physical, chart, labs and discussed the procedure including the risks, benefits and alternatives for the proposed anesthesia with the patient or authorized representative who has indicated his/her understanding and acceptance.     Dental advisory given  Plan Discussed with: CRNA  Anesthesia Plan Comments:        Anesthesia Quick Evaluation

## 2023-08-05 ENCOUNTER — Other Ambulatory Visit (HOSPITAL_COMMUNITY): Payer: Self-pay

## 2023-08-05 ENCOUNTER — Ambulatory Visit (HOSPITAL_COMMUNITY)
Admission: RE | Admit: 2023-08-05 | Discharge: 2023-08-05 | Disposition: A | Discharge status: Home / Self Care | Payer: 59 | Attending: General Surgery | Admitting: General Surgery

## 2023-08-05 ENCOUNTER — Ambulatory Visit (HOSPITAL_BASED_OUTPATIENT_CLINIC_OR_DEPARTMENT_OTHER): Payer: Self-pay | Admitting: Certified Registered"

## 2023-08-05 ENCOUNTER — Encounter (HOSPITAL_COMMUNITY)
Admission: RE | Disposition: A | Discharge status: Home / Self Care | Payer: Self-pay | Source: Home / Self Care | Attending: General Surgery

## 2023-08-05 ENCOUNTER — Ambulatory Visit (HOSPITAL_COMMUNITY): Payer: Self-pay | Admitting: Certified Registered"

## 2023-08-05 ENCOUNTER — Encounter (HOSPITAL_COMMUNITY): Payer: Self-pay | Admitting: General Surgery

## 2023-08-05 ENCOUNTER — Other Ambulatory Visit: Payer: Self-pay

## 2023-08-05 DIAGNOSIS — J45909 Unspecified asthma, uncomplicated: Secondary | ICD-10-CM | POA: Insufficient documentation

## 2023-08-05 DIAGNOSIS — K4030 Unilateral inguinal hernia, with obstruction, without gangrene, not specified as recurrent: Secondary | ICD-10-CM | POA: Diagnosis not present

## 2023-08-05 DIAGNOSIS — D170 Benign lipomatous neoplasm of skin and subcutaneous tissue of head, face and neck: Secondary | ICD-10-CM | POA: Diagnosis not present

## 2023-08-05 DIAGNOSIS — K4031 Unilateral inguinal hernia, with obstruction, without gangrene, recurrent: Secondary | ICD-10-CM | POA: Diagnosis not present

## 2023-08-05 DIAGNOSIS — Z87891 Personal history of nicotine dependence: Secondary | ICD-10-CM | POA: Diagnosis not present

## 2023-08-05 HISTORY — PX: LIPOMA EXCISION: SHX5283

## 2023-08-05 HISTORY — PX: INGUINAL HERNIA REPAIR: SHX194

## 2023-08-05 HISTORY — PX: ORCHIOPEXY: SHX479

## 2023-08-05 SURGERY — REPAIR, HERNIA, INGUINAL, ADULT
Anesthesia: Regional

## 2023-08-05 MED ORDER — SUGAMMADEX SODIUM 200 MG/2ML IV SOLN
INTRAVENOUS | Status: DC | PRN
  Administered 2023-08-05: 200 mg via INTRAVENOUS

## 2023-08-05 MED ORDER — SODIUM CHLORIDE 0.9 % IV SOLN
INTRAVENOUS | Status: DC
  Filled 2023-08-05: qty 6

## 2023-08-05 MED ORDER — AMISULPRIDE (ANTIEMETIC) 5 MG/2ML IV SOLN: 10.0000 mg | Freq: Once | INTRAVENOUS | Status: DC | PRN

## 2023-08-05 MED ORDER — MIDAZOLAM HCL 2 MG/2ML IJ SOLN
2.0000 mg | Freq: Once | INTRAMUSCULAR | Status: AC
  Administered 2023-08-05: 2 mg via INTRAVENOUS
  Filled 2023-08-05: qty 2

## 2023-08-05 MED ORDER — ONDANSETRON HCL 4 MG/2ML IJ SOLN
INTRAMUSCULAR | Status: AC
  Filled 2023-08-05: qty 2

## 2023-08-05 MED ORDER — PROPOFOL 10 MG/ML IV BOLUS
INTRAVENOUS | Status: AC
  Filled 2023-08-05: qty 20

## 2023-08-05 MED ORDER — HYDROMORPHONE HCL 1 MG/ML IJ SOLN
INTRAMUSCULAR | Status: DC | PRN
  Administered 2023-08-05 (×2): .5 mg via INTRAVENOUS

## 2023-08-05 MED ORDER — DEXAMETHASONE SODIUM PHOSPHATE 10 MG/ML IJ SOLN
INTRAMUSCULAR | Status: DC | PRN
  Administered 2023-08-05: 10 mg via INTRAVENOUS

## 2023-08-05 MED ORDER — LACTATED RINGERS IV SOLN: INTRAVENOUS | Status: DC

## 2023-08-05 MED ORDER — ONDANSETRON HCL 4 MG/2ML IJ SOLN: 4.0000 mg | Freq: Once | INTRAMUSCULAR | Status: DC | PRN

## 2023-08-05 MED ORDER — LIDOCAINE HCL (PF) 2 % IJ SOLN
INTRAMUSCULAR | Status: DC | PRN
Start: 2023-08-05 — End: 2023-08-05
  Administered 2023-08-05: 60 mg via INTRADERMAL

## 2023-08-05 MED ORDER — ONDANSETRON HCL 4 MG/2ML IJ SOLN
INTRAMUSCULAR | Status: DC | PRN
  Administered 2023-08-05: 4 mg via INTRAVENOUS

## 2023-08-05 MED ORDER — FENTANYL CITRATE (PF) 100 MCG/2ML IJ SOLN
INTRAMUSCULAR | Status: DC | PRN
  Administered 2023-08-05: 100 ug via INTRAVENOUS

## 2023-08-05 MED ORDER — FENTANYL CITRATE (PF) 100 MCG/2ML IJ SOLN
INTRAMUSCULAR | Status: AC
  Filled 2023-08-05: qty 2

## 2023-08-05 MED ORDER — KETAMINE HCL 10 MG/ML IJ SOLN
INTRAMUSCULAR | Status: DC | PRN
Start: 2023-08-05 — End: 2023-08-05
  Administered 2023-08-05 (×3): 10 mg via INTRAVENOUS

## 2023-08-05 MED ORDER — DEXAMETHASONE SODIUM PHOSPHATE 10 MG/ML IJ SOLN
INTRAMUSCULAR | Status: AC
  Filled 2023-08-05: qty 1

## 2023-08-05 MED ORDER — MEPERIDINE HCL 50 MG/ML IJ SOLN: 6.2500 mg | INTRAMUSCULAR | Status: DC | PRN

## 2023-08-05 MED ORDER — 0.9 % SODIUM CHLORIDE (POUR BTL) OPTIME
TOPICAL | Status: DC | PRN
  Administered 2023-08-05: 1000 mL

## 2023-08-05 MED ORDER — CHLORHEXIDINE GLUCONATE CLOTH 2 % EX PADS: 6.0000 | MEDICATED_PAD | Freq: Once | CUTANEOUS | Status: DC

## 2023-08-05 MED ORDER — ACETAMINOPHEN 500 MG PO TABS
1000.0000 mg | ORAL_TABLET | Freq: Three times a day (TID) | ORAL | Status: AC
Start: 2023-08-05 — End: 2023-08-10

## 2023-08-05 MED ORDER — OXYCODONE HCL 5 MG/5ML PO SOLN: 5.0000 mg | Freq: Once | ORAL | Status: DC | PRN

## 2023-08-05 MED ORDER — CHLORHEXIDINE GLUCONATE 0.12 % MT SOLN
15.0000 mL | Freq: Once | OROMUCOSAL | Status: AC
Start: 2023-08-05 — End: 2023-08-05
  Administered 2023-08-05: 15 mL via OROMUCOSAL

## 2023-08-05 MED ORDER — ORAL CARE MOUTH RINSE: 15.0000 mL | Freq: Once | OROMUCOSAL | Status: AC

## 2023-08-05 MED ORDER — ROCURONIUM BROMIDE 10 MG/ML (PF) SYRINGE
PREFILLED_SYRINGE | INTRAVENOUS | Status: AC
  Filled 2023-08-05: qty 10

## 2023-08-05 MED ORDER — SODIUM CHLORIDE 0.9 % IV SOLN
INTRAVENOUS | Status: DC | PRN
  Administered 2023-08-05: 1000 mL

## 2023-08-05 MED ORDER — MIDAZOLAM HCL 2 MG/2ML IJ SOLN
INTRAMUSCULAR | Status: AC
  Filled 2023-08-05: qty 2

## 2023-08-05 MED ORDER — PROPOFOL 10 MG/ML IV BOLUS
INTRAVENOUS | Status: DC | PRN
  Administered 2023-08-05: 200 mg via INTRAVENOUS

## 2023-08-05 MED ORDER — BUPIVACAINE-EPINEPHRINE (PF) 0.25% -1:200000 IJ SOLN
INTRAMUSCULAR | Status: AC
  Filled 2023-08-05: qty 30

## 2023-08-05 MED ORDER — DEXMEDETOMIDINE HCL IN NACL 80 MCG/20ML IV SOLN
INTRAVENOUS | Status: DC | PRN
  Administered 2023-08-05 (×2): 4 ug via INTRAVENOUS
  Administered 2023-08-05: 8 ug via INTRAVENOUS
  Administered 2023-08-05: 4 ug via INTRAVENOUS

## 2023-08-05 MED ORDER — HYDROMORPHONE HCL 1 MG/ML IJ SOLN
0.2500 mg | INTRAMUSCULAR | Status: DC | PRN
  Administered 2023-08-05: 0.5 mg via INTRAVENOUS

## 2023-08-05 MED ORDER — OXYCODONE HCL 5 MG PO TABS: 5.0000 mg | ORAL_TABLET | Freq: Once | ORAL | Status: DC | PRN

## 2023-08-05 MED ORDER — IBUPROFEN 800 MG PO TABS: 800.0000 mg | ORAL_TABLET | Freq: Three times a day (TID) | ORAL | Status: DC | PRN

## 2023-08-05 MED ORDER — HYDROMORPHONE HCL 2 MG/ML IJ SOLN
INTRAMUSCULAR | Status: AC
Start: 2023-08-05 — End: ?
  Filled 2023-08-05: qty 1

## 2023-08-05 MED ORDER — DEXMEDETOMIDINE HCL IN NACL 80 MCG/20ML IV SOLN
INTRAVENOUS | Status: AC
  Filled 2023-08-05: qty 20

## 2023-08-05 MED ORDER — OXYCODONE HCL 5 MG PO TABS
5.0000 mg | ORAL_TABLET | Freq: Four times a day (QID) | ORAL | 0 refills | Status: DC | PRN
  Filled 2023-08-05: qty 15, 4d supply, fill #0

## 2023-08-05 MED ORDER — HYDROMORPHONE HCL 1 MG/ML IJ SOLN
INTRAMUSCULAR | Status: DC
Start: 2023-08-05 — End: 2023-08-05
  Filled 2023-08-05: qty 1

## 2023-08-05 MED ORDER — KETAMINE HCL 50 MG/5ML IJ SOSY
PREFILLED_SYRINGE | INTRAMUSCULAR | Status: AC
  Filled 2023-08-05: qty 5

## 2023-08-05 MED ORDER — BUPIVACAINE LIPOSOME 1.3 % IJ SUSP
INTRAMUSCULAR | Status: DC | PRN
Start: 2023-08-05 — End: 2023-08-05
  Administered 2023-08-05: 10 mL via PERINEURAL

## 2023-08-05 MED ORDER — ACETAMINOPHEN 500 MG PO TABS
1000.0000 mg | ORAL_TABLET | ORAL | Status: AC
  Administered 2023-08-05: 1000 mg via ORAL
  Filled 2023-08-05: qty 2

## 2023-08-05 MED ORDER — ROCURONIUM BROMIDE 10 MG/ML (PF) SYRINGE
PREFILLED_SYRINGE | INTRAVENOUS | Status: DC | PRN
Start: 2023-08-05 — End: 2023-08-05
  Administered 2023-08-05 (×2): 10 mg via INTRAVENOUS
  Administered 2023-08-05: 80 mg via INTRAVENOUS
  Administered 2023-08-05: 10 mg via INTRAVENOUS

## 2023-08-05 MED ORDER — BUPIVACAINE-EPINEPHRINE 0.25% -1:200000 IJ SOLN
INTRAMUSCULAR | Status: DC | PRN
  Administered 2023-08-05: 12 mL

## 2023-08-05 MED ORDER — ROPIVACAINE HCL 5 MG/ML IJ SOLN
INTRAMUSCULAR | Status: DC | PRN
  Administered 2023-08-05: 20 mL via PERINEURAL

## 2023-08-05 MED ORDER — KETOROLAC TROMETHAMINE 30 MG/ML IJ SOLN
INTRAMUSCULAR | Status: DC | PRN
  Administered 2023-08-05: 30 mg via INTRAVENOUS

## 2023-08-05 MED ORDER — FENTANYL CITRATE PF 50 MCG/ML IJ SOSY
100.0000 ug | PREFILLED_SYRINGE | Freq: Once | INTRAMUSCULAR | Status: AC
  Administered 2023-08-05: 100 ug via INTRAVENOUS
  Filled 2023-08-05: qty 2

## 2023-08-05 MED ORDER — CEFAZOLIN SODIUM-DEXTROSE 2-4 GM/100ML-% IV SOLN
2.0000 g | INTRAVENOUS | Status: AC
Start: 2023-08-05 — End: 2023-08-05
  Administered 2023-08-05: 2 g via INTRAVENOUS
  Filled 2023-08-05: qty 100

## 2023-08-05 SURGICAL SUPPLY — 45 items
BAG COUNTER SPONGE SURGICOUNT (BAG) IMPLANT
BENZOIN TINCTURE PRP APPL 2/3 (GAUZE/BANDAGES/DRESSINGS) IMPLANT
CHLORAPREP W/TINT 26 (MISCELLANEOUS) ×2 IMPLANT
COVER SURGICAL LIGHT HANDLE (MISCELLANEOUS) ×2 IMPLANT
DERMABOND ADVANCED .7 DNX12 (GAUZE/BANDAGES/DRESSINGS) IMPLANT
DRAIN PENROSE 0.5X18 (DRAIN) IMPLANT
DRAPE LAPAROSCOPIC ABDOMINAL (DRAPES) IMPLANT
DRAPE LAPAROTOMY T 102X78X121 (DRAPES) IMPLANT
DRAPE LAPAROTOMY T 98X78 PEDS (DRAPES) ×2 IMPLANT
DRAPE LAPAROTOMY TRNSV 102X78 (DRAPES) IMPLANT
DRAPE SHEET LG 3/4 BI-LAMINATE (DRAPES) IMPLANT
DRAPE UTILITY XL STRL (DRAPES) ×2 IMPLANT
DRSG TEGADERM 2-3/8X2-3/4 SM (GAUZE/BANDAGES/DRESSINGS) IMPLANT
DRSG TEGADERM 4X4.75 (GAUZE/BANDAGES/DRESSINGS) IMPLANT
ELECT CAUTERY BLADE TIP 2.5 (TIP) IMPLANT
ELECT REM PT RETURN 15FT ADLT (MISCELLANEOUS) ×2 IMPLANT
ELECTRODE CAUTERY BLDE TIP 2.5 (TIP) IMPLANT
GAUZE PACKING IODOFORM 1/4X15 (PACKING) IMPLANT
GAUZE SPONGE 2X2 8PLY STRL LF (GAUZE/BANDAGES/DRESSINGS) IMPLANT
GAUZE SPONGE 4X4 12PLY STRL (GAUZE/BANDAGES/DRESSINGS) ×2 IMPLANT
GLOVE BIO SURGEON STRL SZ7.5 (GLOVE) ×2 IMPLANT
GLOVE INDICATOR 8.0 STRL GRN (GLOVE) ×2 IMPLANT
GOWN STRL REUS W/ TWL XL LVL3 (GOWN DISPOSABLE) ×4 IMPLANT
KIT BASIN OR (CUSTOM PROCEDURE TRAY) ×2 IMPLANT
KIT TURNOVER KIT A (KITS) IMPLANT
MARKER SKIN DUAL TIP RULER LAB (MISCELLANEOUS) ×2 IMPLANT
MESH BARD SOFT 3X6IN (Mesh General) IMPLANT
NDL HYPO 22X1.5 SAFETY MO (MISCELLANEOUS) ×2 IMPLANT
NDL HYPO 25X1 1.5 SAFETY (NEEDLE) ×2 IMPLANT
NEEDLE HYPO 22X1.5 SAFETY MO (MISCELLANEOUS) ×2 IMPLANT
NEEDLE HYPO 25X1 1.5 SAFETY (NEEDLE) ×2 IMPLANT
PACK GENERAL/GYN (CUSTOM PROCEDURE TRAY) ×2 IMPLANT
SPIKE FLUID TRANSFER (MISCELLANEOUS) IMPLANT
SPONGE T-LAP 4X18 ~~LOC~~+RFID (SPONGE) IMPLANT
STAPLER SKIN PROX WIDE 3.9 (STAPLE) IMPLANT
STRIP CLOSURE SKIN 1/2X4 (GAUZE/BANDAGES/DRESSINGS) ×2 IMPLANT
SUT MNCRL AB 4-0 PS2 18 (SUTURE) ×2 IMPLANT
SUT PROLENE 2 0 CT2 30 (SUTURE) ×4 IMPLANT
SUT VIC AB 2-0 SH 27X BRD (SUTURE) ×2 IMPLANT
SUT VIC AB 3-0 SH 18 (SUTURE) ×2 IMPLANT
SWAB COLLECTION DEVICE MRSA (MISCELLANEOUS) IMPLANT
SWAB CULTURE ESWAB REG 1ML (MISCELLANEOUS) IMPLANT
SYR 20ML LL LF (SYRINGE) ×2 IMPLANT
SYR CONTROL 10ML LL (SYRINGE) ×2 IMPLANT
TOWEL OR 17X26 10 PK STRL BLUE (TOWEL DISPOSABLE) ×2 IMPLANT

## 2023-08-05 NOTE — Discharge Instructions (Signed)
UMBILICAL OR INGUINAL HERNIA REPAIR: POST OP INSTRUCTIONS  Always review your discharge instruction sheet given to you by the facility where your surgery was performed. IF YOU HAVE DISABILITY OR FAMILY LEAVE FORMS, YOU MUST BRING THEM TO THE OFFICE FOR PROCESSING.   DO NOT GIVE THEM TO YOUR DOCTOR.  A  prescription for pain medication may be given to you upon discharge.  Take your pain medication as prescribed, if needed.  If narcotic pain medicine is not needed, then you may take acetaminophen (Tylenol) or ibuprofen (Advil) as needed. Take your usually prescribed medications unless otherwise directed. If you need a refill on your pain medication, please contact your pharmacy.  They will contact our office to request authorization. Prescriptions will not be filled after 5 pm or on week-ends. You should follow a light diet the first 24 hours after arrival home, such as soup and crackers, etc.  Be sure to include lots of fluids daily.  Resume your normal diet the day after surgery. Most patients will experience some swelling and bruising around the umbilicus or in the groin and scrotum.  Ice packs and reclining will help.  Swelling and bruising can take several days to resolve.  It is common to experience some constipation if taking pain medication after surgery.  Increasing fluid intake and taking a stool softener (such as Colace) will usually help or prevent this problem from occurring.  A mild laxative (Milk of Magnesia or Miralax) should be taken according to package directions if there are no bowel movements after 48 hours. Unless discharge instructions indicate otherwise, you may remove your bandages 24-48 hours after surgery, and you may shower at that time.  You may have steri-strips (small skin tapes) in place directly over the incision.  These strips should be left on the skin for 7-10 days.  If your surgeon used skin glue on the incision, you may shower in 24 hours.  The glue will flake off  over the next 2-3 weeks.  Any sutures or staples will be removed at the office during your follow-up visit. ACTIVITIES:  You may resume regular (light) daily activities beginning the next day--such as daily self-care, walking, climbing stairs--gradually increasing activities as tolerated.  You may have sexual intercourse when it is comfortable.  Refrain from any heavy lifting or straining until approved by your doctor. You may drive when you are no longer taking prescription pain medication, you can comfortably wear a seatbelt, and you can safely maneuver your car and apply brakes. RETURN TO WORK:  You should see your doctor in the office for a follow-up appointment approximately 2-3 weeks after your surgery.  Make sure that you call for this appointment within a day or two after you arrive home to insure a convenient appointment time. OTHER INSTRUCTIONS:     WHEN TO CALL YOUR DOCTOR: Fever over 101.0 Inability to urinate Nausea and/or vomiting Extreme swelling or bruising Continued bleeding from incision. Increased pain, redness, or drainage from the incision  The clinic staff is available to answer your questions during regular business hours.  Please don't hesitate to call and ask to speak to one of the nurses for clinical concerns.  If you have a medical emergency, go to the nearest emergency room or call 911.  A surgeon from [facility] is always on call at the hospital   [address], [place], [place]  [address] ?  [address] [address], [place], [place]   [address] [phone] ?  [phone] ? FAX [phone] Web site: [email or web address]

## 2023-08-05 NOTE — Anesthesia Procedure Notes (Signed)
 Procedure Name: Intubation Date/Time: 08/05/2023 9:54 AM  Performed by: Sindy Guadeloupe, CRNAPre-anesthesia Checklist: Patient identified, Emergency Drugs available, Suction available, Patient being monitored and Timeout performed Patient Re-evaluated:Patient Re-evaluated prior to induction Oxygen Delivery Method: Circle system utilized Preoxygenation: Pre-oxygenation with 100% oxygen Induction Type: IV induction Ventilation: Mask ventilation without difficulty Laryngoscope Size: Mac and 4 Grade View: Grade I Tube type: Oral Tube size: 7.5 mm Number of attempts: 1 Airway Equipment and Method: Stylet Placement Confirmation: ETT inserted through vocal cords under direct vision, positive ETCO2 and breath sounds checked- equal and bilateral Secured at: 23 cm Tube secured with: Tape Dental Injury: Teeth and Oropharynx as per pre-operative assessment

## 2023-08-05 NOTE — Anesthesia Postprocedure Evaluation (Signed)
 Anesthesia Post Note  Patient: Douglas Mclean  Procedure(s) Performed: OPEN REPAIR RIGHT RECURRENT INCARCERATED INGUINAL HERNIA WITH MESH EXCISION FOREHEAD LIPOMA ORCHIOPEXY ADULT     Patient location during evaluation: PACU Anesthesia Type: Regional and General Level of consciousness: awake and alert, oriented and patient cooperative Pain management: pain level controlled Vital Signs Assessment: post-procedure vital signs reviewed and stable Respiratory status: spontaneous breathing, nonlabored ventilation and respiratory function stable Cardiovascular status: blood pressure returned to baseline and stable Postop Assessment: no apparent nausea or vomiting Anesthetic complications: no   No notable events documented.  Last Vitals:  Vitals:   08/05/23 1330 08/05/23 1345  BP: 113/81 117/81  Pulse: 62 70  Resp: 13 15  Temp:    SpO2: 100% 96%    Last Pain:  Vitals:   08/05/23 1345  TempSrc:   PainSc: 0-No pain                 Lannie Fields

## 2023-08-05 NOTE — Brief Op Note (Signed)
 08/05/2023  1:02 PM  PATIENT:  Douglas Mclean  38 y.o. male  PRE-OPERATIVE DIAGNOSIS:  RIGHT RECURRENT CHRONICALLY INCARCERATED INGUINAL, FOREHEAD LIPOMA  POST-OPERATIVE DIAGNOSIS:  SAME  PROCEDURE:  Procedure(s): OPEN REPAIR RIGHT RECURRENT INCARCERATED INGUINAL HERNIA WITH MESH (N/A) EXCISION FOREHEAD LIPOMA (N/A) ORCHIOPEXY ADULT  SURGEON:  Surgeons and Role:    * Gaynelle Adu, MD - Primary    * Berna Bue, MD - Assisting  PHYSICIAN ASSISTANT:   ASSISTANTS: Phylliss Blakes MD FACS   ANESTHESIA:   general and TAP block  EBL:  20 mL   BLOOD ADMINISTERED:none  DRAINS: none   LOCAL MEDICATIONS USED:  MARCAINE     SPECIMEN:  Source of Specimen:  lipoma  DISPOSITION OF SPECIMEN:  PATHOLOGY  COUNTS:  YES  TOURNIQUET:  * No tourniquets in log *  DICTATION: .Dragon Dictation  PLAN OF CARE: Discharge to home after PACU  PATIENT DISPOSITION:  PACU - hemodynamically stable.   Delay start of Pharmacological VTE agent (>24hrs) due to surgical blood loss or risk of bleeding: not applicable  Mary Sella. Andrey Campanile, MD, FACS General, Bariatric, & Minimally Invasive Surgery [facility],  A [facility]

## 2023-08-05 NOTE — Op Note (Signed)
 Open Repair Of Recurrent Incarcerated Right indirect Inguinal Hernia Repair with Mesh Right orchiopexy Procedure Note (22 modifier); excision of right forehead lipoma 2 cm  Indications: The patient presented with a history of a right, not reducible and recurrent right hernia.  Pt also had symptomatic right forehead lipoma and desired excision  Pre-operative Diagnosis:  right recurrent chronically incarcerated inguinal hernia  ; right forehead 2 cm lipoma  Post-operative Diagnosis: same  Surgeon: Gaynelle Adu MD FACS  Assistants: Phylliss Blakes MD FACS (An assistant was called into the operating room to help identify the anatomy, tissue retraction and manipulation.)  Findings: Extremely large right indirect inguinal hernia sac that was plastered to cord contents as well as what appeared to be a very large stretched tunica vaginalis like you would see with a hydrocele although there was no hydocele sac per se.  It took an extended period of time to dissect out the inguinal sac from the cord contents.  Because the anatomy was very distorted and Designer, television/film set was requested to help with the anatomy.  Therefore a 22 modifier should be applied to this case due to increased time in the OR, technical complexity  Anesthesia: General endotracheal anesthesia + TAP block  Surgeon: Mary Sella. Andrey Campanile, MD, FACS  Procedure Details  The patient was seen again in the Holding Room. The risks, benefits, complications, treatment options, and expected outcomes were discussed with the patient. The possibilities of reaction to medication, pulmonary aspiration, perforation of viscus, bleeding, recurrent infection, the need for additional procedures, and development of a complication requiring transfusion or further operation were discussed with the patient and/or family. The likelihood of success in repairing the hernia and returning the patient to their previous functional status is good.  There was concurrence with the  proposed plan, and informed consent was obtained. The site of surgery was properly noted/marked. The patient was taken to the Operating Room, identified as Douglas Mclean, and the procedure verified as right inguinal hernia repair  & excision of right forehead lipoma.  A Time Out was held and the above information confirmed.  The patient was placed in the supine position and underwent induction of anesthesia. The lower abdomen and groin was prepped with Chloraprep and draped in the standard fashion; The scrotum and penis were also prepped and draped with Betadine, and 0.25% Marcaine with epinephrine was used to anesthetize the skin over the mid-portion of the inguinal canal. An oblique incision was made. Dissection was carried down through the subcutaneous tissue with cautery to the external oblique fascia.  We opened the external oblique fascia along the direction of its fibers to the external ring.  The bulky cord contents and incarcerated inguinal hernia were circumferentially dissected bluntly and retracted with a Penrose drain.  The floor of the inguinal canal was inspected and no obvious direct defect noted.  It took some time to separate the cord contents.  Initially there appeared to be just a very large thickened hernia sac.  There were some dense cremasteric fibers along the lateral edge of the external oblique aponeurosis that were somewhat fused to the external oblique aponeurosis.  We ended up taking those down with electrocautery.  I worked for a while by myself but Dr. Doylene Canard my partner was available and I asked her to join me in the operating room due to the distortion of the anatomy.  Patient had had a prior repair as an infant.  This is a chronically incarcerated hernia.  The thickened sac went all  the way down to the testicle and the testicle was delivered out of the scrotum.  I identified the vas deferens and testicular vessels.  We ultimately were able to separate an indirect hernia sac  which was quite large from a thickened peritonealized sac that ended in the right testicle.  There was no evidence of bladder involvement.  Did not really see the ilioinguinal nerve per se.  This thickened sac that went down and encased the right testicle was medial and the true cord contents (testicular vessels and vas deferens were lateral).  It was unclear whether or not we should take down this thickened tissue that was extending down and involving the right testicle.  The urologist was available and Dr. Cardell Peach came to the operating room and looked over the anatomy.  It was almost like this thickened sac was a hydrocele in his opinion however there is no fluid in it.  He recommended debulking this tissue so we excised it leaving some of it attached to the right testicle staying well away from the epididymis.  He recommended then doing an orchiopexy.  We invaginated the base of the right scrotum into the field and tacked the testicle to the scrotum with a 3-0 Vicryl.  We ensured that the testicle was not twisted onto itself and placed it back into the scrotum.  We then turned our attention to the tissue around the pubic tubercle.  There was some what appeared to be thickened cremasteric fibers in this location.  We ended up dividing them and tying them off with 3-0 Vicryl.  The large indirect inguinal hernia sac was easily reduced into the abdomen.  At this point the anatomy was looking like the typical anatomy 1 would see at this point in the case.  We used a 3 x 6 inch piece of Bard soft mesh, which was cut into a keyhole shape.  This was secured with 2-0 Prolene, beginning at the pubic tubercle, running this along the shelving edge inferiorly. Superiorly, the mesh was secured to the internal oblique fascia with interrupted 2-0 Prolene sutures.  The tails of the mesh were sutured together behind the spermatic cord.  The mesh was tucked underneath the external oblique fascia laterally.  The external oblique fascia  was reapproximated with 2-0 Vicryl.  3-0 Vicryl was used to close the subcutaneous tissues and 4-0 Monocryl was used to close the skin in subcuticular fashion.  Dermabond was used to seal the incision.  It took an extended period of time to delineate the inguinal anatomy due to scarring from prior surgery as an infant as well as the chronicity of his large inguinal scrotal hernia.  It was technically more challenging requiring assist surgeon to help with the anatomy and tissue retraction and manipulation.  Therefore 22 modifier should be applied  I then turned my attention to the second procedure which was the excision of the right forehead lipoma.  It was about 2 inches above and lateral to the right eyebrow.  The area was prepped with ChloraPrep.  A timeout was performed.  I made a 2 cm incision directly over the palpable lesion.  Subcutaneous tissue and deep dermis were divided.  There was a well-circumscribed fatty soft tissue mass about 2 cm which was excised.  Hemostasis was achieved.  Local was infiltrated.  I closed the deep dermis with interrupted 3-0 Vicryl then closed the skin with a running 4 Monocryl followed by the application of Steri-Strips 2 x 2 and a small  Tegaderm.     The patient was then extubated and brought to the recovery room in stable condition.  All sponge, instrument, and needle counts were correct prior to closure and at the conclusion of the case.   Estimated Blood Loss: less than 50 mL                 Complications: None; patient tolerated the procedure well.         Disposition: PACU - hemodynamically stable.         Condition: stable  Mary Sella. Andrey Campanile, MD, FACS General, Bariatric, & Minimally Invasive Surgery [facility],  A [facility]

## 2023-08-05 NOTE — Anesthesia Procedure Notes (Signed)
 Anesthesia Regional Block: TAP block   Pre-Anesthetic Checklist: , timeout performed,  Correct Patient, Correct Site, Correct Laterality,  Correct Procedure, Correct Position, site marked,  Risks and benefits discussed,  Surgical consent,  Pre-op evaluation,  At surgeon's request and post-op pain management  Laterality: Right  Prep: Maximum Sterile Barrier Precautions used, chloraprep       Needles:  Injection technique: Single-shot  Needle Type: Echogenic Stimulator Needle     Needle Length: 9cm  Needle Gauge: 22     Additional Needles:   Procedures:,,,, ultrasound used (permanent image in chart),,    Narrative:  Start time: 08/05/2023 8:10 AM End time: 08/05/2023 8:15 AM Injection made incrementally with aspirations every 5 mL.  Performed by: Personally  Anesthesiologist: Lannie Fields, DO  Additional Notes: Monitors applied. No increased pain on injection. No increased resistance to injection. Injection made in 5cc increments. Good needle visualization. Patient tolerated procedure well.
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
## 2023-08-05 NOTE — Transfer of Care (Signed)
 Immediate Anesthesia Transfer of Care Note  Patient: Douglas Mclean  Procedure(s) Performed: OPEN REPAIR RIGHT RECURRENT INCARCERATED INGUINAL HERNIA WITH MESH EXCISION FOREHEAD LIPOMA ORCHIOPEXY ADULT  Patient Location: PACU  Anesthesia Type:General  Level of Consciousness: awake, alert , and patient cooperative  Airway & Oxygen Therapy: Patient Spontanous Breathing and Patient connected to face mask oxygen  Post-op Assessment: Report given to RN and Post -op Vital signs reviewed and stable  Post vital signs: Reviewed and stable  Last Vitals:  Vitals Value Taken Time  BP 115/87 08/05/23 1315  Temp    Pulse 64 08/05/23 1315  Resp 20 08/05/23 1315  SpO2 100 % 08/05/23 1315  Vitals shown include unfiled device data.  Last Pain:  Vitals:   08/05/23 0900  TempSrc:   PainSc: Asleep         Complications: No notable events documented.

## 2023-08-06 ENCOUNTER — Encounter (HOSPITAL_COMMUNITY): Payer: Self-pay | Admitting: General Surgery

## 2023-08-06 LAB — SURGICAL PATHOLOGY

## 2023-12-06 ENCOUNTER — Other Ambulatory Visit: Payer: Self-pay | Admitting: General Surgery

## 2023-12-06 ENCOUNTER — Encounter: Payer: Self-pay | Admitting: General Surgery

## 2023-12-06 DIAGNOSIS — R1909 Other intra-abdominal and pelvic swelling, mass and lump: Secondary | ICD-10-CM

## 2023-12-11 ENCOUNTER — Ambulatory Visit
Admission: RE | Admit: 2023-12-11 | Discharge: 2023-12-11 | Disposition: A | Discharge status: Home / Self Care | Payer: PRIVATE HEALTH INSURANCE | Source: Ambulatory Visit | Attending: General Surgery | Admitting: General Surgery

## 2023-12-11 DIAGNOSIS — R1909 Other intra-abdominal and pelvic swelling, mass and lump: Secondary | ICD-10-CM

## 2023-12-11 MED ORDER — IOPAMIDOL (ISOVUE-300) INJECTION 61%
100.0000 mL | Freq: Once | INTRAVENOUS | Status: AC | PRN
  Administered 2023-12-11: 100 mL via INTRAVENOUS

## 2024-04-14 ENCOUNTER — Telehealth: Admitting: Physician Assistant

## 2024-04-14 DIAGNOSIS — N499 Inflammatory disorder of unspecified male genital organ: Secondary | ICD-10-CM

## 2024-04-14 NOTE — Progress Notes (Signed)
   Thank you for the details you included in the comment boxes. Those details are very helpful in determining the best course of treatment for you and help us  to provide the best care.Because this is something that requires further discussion and that we are not authorized to treat in men over e-visit alone, we recommend that you schedule a Virtual Urgent Care video visit in order for the provider to better assess what is going on.  The provider will be able to give you a more accurate diagnosis and treatment plan if we can more freely discuss your symptoms and with the addition of a virtual examination.   If you change your visit to a video visit, we will bill your insurance (similar to an office visit) and you will not be charged for this e-Visit. You will be able to stay at home and speak with the first available [facility] advanced practice provider. The link to do a video visit is in the drop down Menu tab of your Welcome screen in MyChart.

## 2024-04-15 ENCOUNTER — Telehealth: Admitting: Physician Assistant

## 2024-04-15 DIAGNOSIS — A498 Other bacterial infections of unspecified site: Secondary | ICD-10-CM

## 2024-04-15 MED ORDER — METRONIDAZOLE 500 MG PO TABS: 500.0000 mg | ORAL_TABLET | Freq: Two times a day (BID) | ORAL | 0 refills | Status: DC

## 2024-04-15 MED ORDER — METRONIDAZOLE 500 MG PO TABS: 500.0000 mg | ORAL_TABLET | Freq: Two times a day (BID) | ORAL | 0 refills | Status: AC

## 2024-04-15 NOTE — Progress Notes (Signed)
 Virtual Visit Consent   Douglas Mclean, you are scheduled for a virtual visit with a [facility] provider today. Just as with appointments in the office, your consent must be obtained to participate. Your consent will be active for this visit and any virtual visit you may have with one of our providers in the next 365 days. If you have a MyChart account, a copy of this consent can be sent to you electronically.  As this is a virtual visit, video technology does not allow for your provider to perform a traditional examination. This may limit your provider's ability to fully assess your condition. If your provider identifies any concerns that need to be evaluated in person or the need to arrange testing (such as labs, EKG, etc.), we will make arrangements to do so. Although advances in technology are sophisticated, we cannot ensure that it will always work on either your end or our end. If the connection with a video visit is poor, the visit may have to be switched to a telephone visit. With either a video or telephone visit, we are not always able to ensure that we have a secure connection.  By engaging in this virtual visit, you consent to the provision of healthcare and authorize for your insurance to be billed (if applicable) for the services provided during this visit. Depending on your insurance coverage, you may receive a charge related to this service.  I need to obtain your verbal consent now. Are you willing to proceed with your visit today? Douglas Mclean has provided verbal consent on 04/15/2024 for a virtual visit (video or telephone). Douglas CHRISTELLA Dickinson, PA-C  Date: 04/15/2024 2:40 PM   Virtual Visit via Video Note   I, Douglas Mclean, connected with  Douglas Mclean  ([id number], 1985/08/20) on 04/15/24 at  2:00 PM EST by a video-enabled telemedicine application and verified that I am speaking with the correct person using two identifiers.  Location: Patient: Virtual Visit  Location Patient: Home Provider: Virtual Visit Location Provider: Home Office   I discussed the limitations of evaluation and management by telemedicine and the availability of in person appointments. The patient expressed understanding and agreed to proceed.    History of Present Illness: Douglas Mclean is a 38 y.o. who identifies as a male who was assigned male at birth, and is being seen today for partner keeps having recurrent BV. She had been told by her provider her SO may need treatment as well to help her as he may be reinfecting her. He is seeking that treatment. He has no symptoms.   Problems:  Patient Active Problem List   Diagnosis Date Noted   Screen for STD (sexually transmitted disease) 07/04/2021   Epidermal inclusion cyst 07/04/2021   Unilateral recurrent inguinal hernia without obstruction or gangrene 07/04/2021   Eczema 07/04/2021   Primary insomnia 07/04/2021    Allergies: No Known Allergies Medications:  Current Outpatient Medications:    metroNIDAZOLE  (FLAGYL ) 500 MG tablet, Take 1 tablet (500 mg total) by mouth 2 (two) times daily for 7 days., Disp: 14 tablet, Rfl: 0   ALEVE  220 MG tablet, Take 220-440 mg by mouth 2 (two) times daily as needed (for dental or general pain)., Disp: , Rfl:    doxycycline  (VIBRAMYCIN ) 100 MG capsule, Take 1 capsule (100 mg total) by mouth 2 (two) times daily. (Patient not taking: Reported on 08/05/2023), Disp: 20 capsule, Rfl: 0   hydrocortisone  valerate cream (WESTCORT ) 0.2 %, Apply 1 application topically 2 (two)  times daily. For rash on left ear. (Patient not taking: Reported on 08/05/2023), Disp: 45 g, Rfl: 0   ibuprofen  (ADVIL ) 800 MG tablet, Take 1 tablet (800 mg total) by mouth every 8 (eight) hours as needed., Disp: , Rfl:    oxyCODONE  (OXY IR/ROXICODONE ) 5 MG immediate release tablet, Take 1 tablet (5 mg total) by mouth every 6 (six) hours as needed for severe pain (pain score 7-10)., Disp: 15 tablet, Rfl: 0   ZYRTEC ALLERGY 10  MG tablet, Take 10 mg by mouth daily., Disp: , Rfl:   Observations/Objective: Patient is well-developed, well-nourished in no acute distress.  Resting comfortably at home.  Head is normocephalic, atraumatic.  No labored breathing.  Speech is clear and coherent with logical content.  Patient is alert and oriented at baseline.    Assessment and Plan: 1. Gardnerella infection (Primary) - metroNIDAZOLE  (FLAGYL ) 500 MG tablet; Take 1 tablet (500 mg total) by mouth 2 (two) times daily for 7 days.  Dispense: 14 tablet; Refill: 0  - Will treat for Gardnerella infection as above - If she continues to have issues or if he develops any symptoms he should seek in person evaluation for testing  Follow Up Instructions: I discussed the assessment and treatment plan with the patient. The patient was provided an opportunity to ask questions and all were answered. The patient agreed with the plan and demonstrated an understanding of the instructions.  A copy of instructions were sent to the patient via MyChart unless otherwise noted below.    The patient was advised to call back or seek an in-person evaluation if the symptoms worsen or if the condition fails to improve as anticipated.    Douglas CHRISTELLA Dickinson, PA-C

## 2024-04-15 NOTE — Patient Instructions (Signed)
  Penne Na, thank you for joining Delon CHRISTELLA Dickinson, PA-C for today's virtual visit.  While this provider is not your primary care provider (PCP), if your PCP is located in our provider database this encounter information will be shared with them immediately following your visit.   A [facility] MyChart account gives you access to today's visit and all your visits, tests, and labs performed at [facility]  click here if you don't have a [facility] MyChart account or go to [email or web address]  Consent: (Patient) Tequan Redmon provided verbal consent for this virtual visit at the beginning of the encounter.  Current Medications:  Current Outpatient Medications:    metroNIDAZOLE  (FLAGYL ) 500 MG tablet, Take 1 tablet (500 mg total) by mouth 2 (two) times daily for 7 days., Disp: 14 tablet, Rfl: 0   ALEVE  220 MG tablet, Take 220-440 mg by mouth 2 (two) times daily as needed (for dental or general pain)., Disp: , Rfl:    doxycycline  (VIBRAMYCIN ) 100 MG capsule, Take 1 capsule (100 mg total) by mouth 2 (two) times daily. (Patient not taking: Reported on 08/05/2023), Disp: 20 capsule, Rfl: 0   hydrocortisone  valerate cream (WESTCORT ) 0.2 %, Apply 1 application topically 2 (two) times daily. For rash on left ear. (Patient not taking: Reported on 08/05/2023), Disp: 45 g, Rfl: 0   ibuprofen  (ADVIL ) 800 MG tablet, Take 1 tablet (800 mg total) by mouth every 8 (eight) hours as needed., Disp: , Rfl:    oxyCODONE  (OXY IR/ROXICODONE ) 5 MG immediate release tablet, Take 1 tablet (5 mg total) by mouth every 6 (six) hours as needed for severe pain (pain score 7-10)., Disp: 15 tablet, Rfl: 0   ZYRTEC ALLERGY 10 MG tablet, Take 10 mg by mouth daily., Disp: , Rfl:    Medications ordered in this encounter:  Meds ordered this encounter  Medications   metroNIDAZOLE  (FLAGYL ) 500 MG tablet    Sig: Take 1 tablet (500 mg total) by mouth 2 (two) times daily for 7 days.    Dispense:  14 tablet     Refill:  0    Supervising Provider:   BLAISE ALEENE KIDD [[id number]]     *If you need refills on other medications prior to your next appointment, please contact your pharmacy*  Follow-Up: Call back or seek an in-person evaluation if the symptoms worsen or if the condition fails to improve as anticipated.  [facility] [phone]   If you have been instructed to have an in-person evaluation today at a local Urgent Care facility, please use the link below. It will take you to a list of all of our available [facility], including address, phone number and hours of operation. Please do not delay care.  [facility]  If you or a family member do not have a primary care provider, use the link below to schedule a visit and establish care. When you choose a [facility] primary care physician or advanced practice provider, you gain a long-term partner in health. Find a Primary Care Provider  Learn more about [facility]'s in-office and virtual care options: [facility] - Get Care Now

## 2024-04-15 NOTE — Addendum Note (Signed)
 Addended by: VIVIENNE DELON HERO on: 04/15/2024 04:10 PM   Modules accepted: Orders

## 2024-05-13 ENCOUNTER — Emergency Department (HOSPITAL_COMMUNITY): Admission: EM | Admit: 2024-05-13 | Discharge: 2024-05-13 | Disposition: A | Discharge status: Home / Self Care

## 2024-05-13 ENCOUNTER — Emergency Department (HOSPITAL_BASED_OUTPATIENT_CLINIC_OR_DEPARTMENT_OTHER)
Admission: EM | Admit: 2024-05-13 | Discharge: 2024-05-13 | Disposition: A | Discharge status: Home / Self Care | Attending: Emergency Medicine | Admitting: Emergency Medicine

## 2024-05-13 ENCOUNTER — Encounter (HOSPITAL_BASED_OUTPATIENT_CLINIC_OR_DEPARTMENT_OTHER): Payer: Self-pay

## 2024-05-13 ENCOUNTER — Other Ambulatory Visit: Payer: Self-pay

## 2024-05-13 DIAGNOSIS — K920 Hematemesis: Secondary | ICD-10-CM | POA: Diagnosis not present

## 2024-05-13 DIAGNOSIS — R066 Hiccough: Secondary | ICD-10-CM | POA: Insufficient documentation

## 2024-05-13 DIAGNOSIS — R112 Nausea with vomiting, unspecified: Secondary | ICD-10-CM | POA: Diagnosis present

## 2024-05-13 LAB — CBC WITH DIFFERENTIAL/PLATELET
Abs Immature Granulocytes: 0.01 K/uL (ref 0.00–0.07)
Basophils Absolute: 0 K/uL (ref 0.0–0.1)
Basophils Relative: 0 %
Eosinophils Absolute: 0 K/uL (ref 0.0–0.5)
Eosinophils Relative: 0 %
HCT: 40.2 % (ref 39.0–52.0)
Hemoglobin: 14.3 g/dL (ref 13.0–17.0)
Immature Granulocytes: 0 %
Lymphocytes Relative: 42 %
Lymphs Abs: 3.8 K/uL (ref 0.7–4.0)
MCH: 33.6 pg (ref 26.0–34.0)
MCHC: 35.6 g/dL (ref 30.0–36.0)
MCV: 94.4 fL (ref 80.0–100.0)
Monocytes Absolute: 0.8 K/uL (ref 0.1–1.0)
Monocytes Relative: 9 %
Neutro Abs: 4.3 K/uL (ref 1.7–7.7)
Neutrophils Relative %: 49 %
Platelets: 226 K/uL (ref 150–400)
RBC: 4.26 MIL/uL (ref 4.22–5.81)
RDW: 11.4 % — ABNORMAL LOW (ref 11.5–15.5)
WBC: 9 K/uL (ref 4.0–10.5)
nRBC: 0 % (ref 0.0–0.2)

## 2024-05-13 LAB — URINALYSIS, ROUTINE W REFLEX MICROSCOPIC
Bilirubin Urine: NEGATIVE
Glucose, UA: NEGATIVE mg/dL
Hgb urine dipstick: NEGATIVE
Ketones, ur: NEGATIVE mg/dL
Leukocytes,Ua: NEGATIVE
Nitrite: NEGATIVE
Protein, ur: NEGATIVE mg/dL
Specific Gravity, Urine: 1.016 (ref 1.005–1.030)
pH: 5.5 (ref 5.0–8.0)

## 2024-05-13 LAB — COMPREHENSIVE METABOLIC PANEL WITH GFR
ALT: 15 U/L (ref 0–44)
AST: 22 U/L (ref 15–41)
Albumin: 4.2 g/dL (ref 3.5–5.0)
Alkaline Phosphatase: 89 U/L (ref 38–126)
Anion gap: 9 (ref 5–15)
BUN: 12 mg/dL (ref 6–20)
CO2: 30 mmol/L (ref 22–32)
Calcium: 9.7 mg/dL (ref 8.9–10.3)
Chloride: 104 mmol/L (ref 98–111)
Creatinine, Ser: 1.22 mg/dL (ref 0.61–1.24)
GFR, Estimated: >60 mL/min (ref >60)
Glucose, Bld: 98 mg/dL (ref 70–99)
Potassium: 3.2 mmol/L — ABNORMAL LOW (ref 3.5–5.1)
Sodium: 143 mmol/L (ref 135–145)
Total Bilirubin: 0.5 mg/dL (ref 0.0–1.2)
Total Protein: 7.5 g/dL (ref 6.5–8.1)

## 2024-05-13 LAB — LIPASE, BLOOD: Lipase: 35 U/L (ref 11–51)

## 2024-05-13 MED ORDER — PANTOPRAZOLE SODIUM 40 MG IV SOLR
40.0000 mg | Freq: Once | INTRAVENOUS | Status: AC
  Administered 2024-05-13: 11:00:00 40 mg via INTRAVENOUS
  Filled 2024-05-13: qty 10

## 2024-05-13 MED ORDER — METOCLOPRAMIDE HCL 5 MG/ML IJ SOLN
10.0000 mg | Freq: Once | INTRAMUSCULAR | Status: AC
  Administered 2024-05-13: 11:00:00 10 mg via INTRAVENOUS
  Filled 2024-05-13: qty 2

## 2024-05-13 MED ORDER — DIPHENHYDRAMINE HCL 50 MG/ML IJ SOLN
25.0000 mg | Freq: Once | INTRAMUSCULAR | Status: AC
  Administered 2024-05-13: 11:00:00 25 mg via INTRAVENOUS
  Filled 2024-05-13: qty 1

## 2024-05-13 NOTE — Discharge Instructions (Addendum)
 Please read and follow all provided instructions.  Your diagnoses today include:  1. Hiccups   2. Hematemesis with nausea     Tests performed today include: Complete blood cell count: Normal white blood cell plate and red blood cell count Complete metabolic panel: Normal liver and kidney function, potassium was slightly low Lipase (pancreas function test): Was normal Vital signs. See below for your results today.   Medications prescribed:  Reglan : Medication for nausea, may potentially help with hiccups  Take any prescribed medications only as directed.  Home care instructions:  Follow any educational materials contained in this packet.  Stop taking dexamethasone  due to intolerance of hiccups.  Follow-up instructions: Please follow-up with your primary care provider in the next 3 days for further evaluation of your symptoms.   Return instructions:  Please return to the Emergency Department if you experience worsening symptoms.  Return with development of worsening abdominal pain, large amounts of blood being passed in the vomit or stool, fever, lightheadedness or passing out. Please return if you have any other emergent concerns.  Additional Information:  Your vital signs today were: BP (!) 133/99 (BP Location: Right Arm)   Pulse 74   Temp 98.7 F (37.1 C)   Resp 17   SpO2 99%  If your blood pressure (BP) was elevated above 135/85 this visit, please have this repeated by your doctor within one month. --------------

## 2024-05-13 NOTE — ED Provider Notes (Signed)
 [place] EMERGENCY DEPARTMENT AT [facility] Provider Note   CSN: [id number] Arrival date & time: 05/13/24  9043     Patient presents with: Emesis   Douglas Mclean is a 38 y.o. male.   Patient presents to the emergency department today for evaluation of nausea and vomiting, hiccups, small-volume hematemesis.  Patient had oral surgery on 05/08/2024.  He was placed on clindamycin  which he has taken.  He was also prescribed dexamethasone  by oral surgery which he started on 12/14.  The subsequent day he developed frequent hiccups, very forceful at times, leading to nausea and vomiting.  Patient has had multiple episodes of nausea and vomiting, now with scant amounts of red blood noted in the vomit.  Patient went to urgent care this morning, was prescribed Zofran  and famotidine.  He had a dose of Zofran  there.  The symptoms continued, prompting emergency department evaluation.  No fevers.  He has pain in his throat and in the epigastric area.  No urinary symptoms.  No constipation or diarrhea.  He denies heavy NSAID use or alcohol use.  He does smoke.  No history of peptic ulcer disease.       Prior to Admission medications  Medication Sig Start Date End Date Taking? Authorizing Provider  ALEVE  220 MG tablet Take 220-440 mg by mouth 2 (two) times daily as needed (for dental or general pain).    [provider]  doxycycline  (VIBRAMYCIN ) 100 MG capsule Take 1 capsule (100 mg total) by mouth 2 (two) times daily. Patient not taking: Reported on 08/05/2023 08/14/21   Hazen Darryle BRAVO, FNP  hydrocortisone  valerate cream (WESTCORT ) 0.2 % Apply 1 application topically 2 (two) times daily. For rash on left ear. Patient not taking: Reported on 08/05/2023 07/04/21   Berneta Elsie Sayre, MD  ibuprofen  (ADVIL ) 800 MG tablet Take 1 tablet (800 mg total) by mouth every 8 (eight) hours as needed. 08/05/23   Tanda Locus, MD  oxyCODONE  (OXY IR/ROXICODONE ) 5 MG immediate release tablet Take 1  tablet (5 mg total) by mouth every 6 (six) hours as needed for severe pain (pain score 7-10). 08/05/23   Tanda Locus, MD  ZYRTEC ALLERGY 10 MG tablet Take 10 mg by mouth daily.    [provider]    Allergies: Dexamethasone     Review of Systems  Updated Vital Signs BP (!) 133/99 (BP Location: Right Arm)   Pulse 74   Temp 98.7 F (37.1 C)   Resp 17   SpO2 99%   Physical Exam Vitals and nursing note reviewed.  Constitutional:      General: He is not in acute distress.    Appearance: He is well-developed.     Comments: Frequent hiccups during exam  HENT:     Head: Normocephalic and atraumatic.  Eyes:     General:        Right eye: No discharge.        Left eye: No discharge.     Conjunctiva/sclera: Conjunctivae normal.  Cardiovascular:     Rate and Rhythm: Normal rate and regular rhythm.     Heart sounds: Normal heart sounds.  Pulmonary:     Effort: Pulmonary effort is normal.     Breath sounds: Normal breath sounds.  Abdominal:     Palpations: Abdomen is soft.     Tenderness: There is abdominal tenderness. There is no guarding or rebound.     Comments: Mild epigastric tenderness  Musculoskeletal:     Cervical back: Normal range  of motion and neck supple.  Skin:    General: Skin is warm and dry.  Neurological:     Mental Status: He is alert.     (all labs ordered are listed, but only abnormal results are displayed) Labs Reviewed  CBC WITH DIFFERENTIAL/PLATELET - Abnormal; Notable for the following components:      Result Value   RDW 11.4 (*)    All other components within normal limits  COMPREHENSIVE METABOLIC PANEL WITH GFR - Abnormal; Notable for the following components:   Potassium 3.2 (*)    All other components within normal limits  LIPASE, BLOOD  URINALYSIS, ROUTINE W REFLEX MICROSCOPIC    EKG: None  Radiology: No results found.   Procedures   Medications Ordered in the ED  metoCLOPramide  (REGLAN ) injection 10 mg (10 mg Intravenous  Given 05/13/24 1042)  diphenhydrAMINE  (BENADRYL ) injection 25 mg (25 mg Intravenous Given 05/13/24 1042)  pantoprazole  (PROTONIX ) injection 40 mg (40 mg Intravenous Given 05/13/24 1042)   ED Course  Patient seen and examined. History obtained directly from patient.  Reviewed urgent care note.  Labs/EKG: Ordered CBC, CMP, lipase, UA.  Imaging: None ordered  Medications/Fluids: Ordered: IV Reglan , IV Zofran , IV Protonix .   Most recent vital signs reviewed and are as follows: BP (!) 133/99 (BP Location: Right Arm)   Pulse 74   Temp 98.7 F (37.1 C)   Resp 17   SpO2 99%   Initial impression: Likely hiccups leading to nausea and vomiting, related to dexamethasone  use.  Will treat symptomatically and given hematemesis, check lab work.  12:59 PM Reassessment performed. Patient appears improved on recheck, prior to p.o. challenge.  Patient was able to sleep for a bed, no persistent hiccups when I checked on him.  Passed p.o. challenge.  Labs personally reviewed and interpreted including: CBC with normal white blood cell count and hemoglobin; CMP with normal BUN, mild hypokalemia 3.2 otherwise unremarkable; lipase normal.  Reviewed pertinent lab work and imaging with patient at bedside. Questions answered.   Most current vital signs reviewed and are as follows: BP (!) 133/99 (BP Location: Right Arm)   Pulse 74   Temp 98.7 F (37.1 C)   Resp 17   SpO2 99%   Plan: Discharge to home.   Prescriptions written for: Reglan  and lieu of Zofran  prescribed at urgent care, urgent care also prescribed Pepcid  Other home care instructions discussed: Benjamine diet, discontinue dexamethasone   ED return instructions discussed: The patient was urged to return to the Emergency Department immediately with worsening of current symptoms, worsening abdominal pain, persistent vomiting, blood noted in stools, fever, or any other concerns. The patient verbalized understanding.   Follow-up instructions  discussed: Patient encouraged to follow-up with their PCP in 3 days for persistent symptoms.                                   Medical Decision Making Amount and/or Complexity of Data Reviewed Labs: ordered.  Risk Prescription drug management.   Patient presents with nausea and vomiting and upper abdominal pain that coincided with initiation of persistent hiccups.  This started after initiating dexamethasone  after recent oral surgery.  Patient is also on clindamycin  recently.  Lab workup is very reassuring.  Patient noted some streaks of blood, suspected to be due to Mallory-Weiss tears.  No strong risk factors for peptic ulcer disease.  No significant abdominal tenderness which would suggest a perforated  ulcer.  Do not feel that he requires advanced imaging given clinical improvement.  Patient will discontinue dexamethasone , use Reglan  for nausea.  Return instructions discussed as above.  The patient's vital signs, pertinent lab work and imaging were reviewed and interpreted as discussed in the ED course. Hospitalization was considered for further testing, treatments, or serial exams/observation. However as patient is well-appearing, has a stable exam, and reassuring studies today, I do not feel that they warrant admission at this time. This plan was discussed with the patient who verbalizes agreement and comfort with this plan and seems reliable and able to return to the Emergency Department with worsening or changing symptoms.       Final diagnoses:  Hiccups  Hematemesis with nausea    ED Discharge Orders     None          Desiderio Chew, DEVONNA 05/13/24 1302    Plunkett, Whitney, MD 05/13/24 1421

## 2024-05-13 NOTE — ED Notes (Signed)
 Pt d/c instructions, medications, and follow-up care reviewed with pt. Pt verbalized understanding and had no further questions at time of d/c. Pt discharged with family. Pt CA&Ox4, ambulatory, and in NAD at time of d/c

## 2024-05-13 NOTE — ED Notes (Signed)
 Pt given water and saltine crackers to PO challenge at this time.

## 2024-05-13 NOTE — ED Triage Notes (Signed)
 He c/o n/v (no diarrhea) x 2 days. He cites recent tooth extraction and is taking Clinda and decadron .
